# Patient Record
Sex: Male | Born: 2005 | Race: White | Hispanic: No | Marital: Single | Smoking: Never smoker
Health system: Southern US, Community
[De-identification: ages and names within clinical notes are randomized; demographics above are authoritative.]

## PROBLEM LIST (undated history)

## (undated) DIAGNOSIS — G809 Cerebral palsy, unspecified: Secondary | ICD-10-CM

## (undated) DIAGNOSIS — G009 Bacterial meningitis, unspecified: Secondary | ICD-10-CM

## (undated) DIAGNOSIS — I619 Nontraumatic intracerebral hemorrhage, unspecified: Secondary | ICD-10-CM

## (undated) DIAGNOSIS — R6250 Unspecified lack of expected normal physiological development in childhood: Secondary | ICD-10-CM

## (undated) DIAGNOSIS — Z982 Presence of cerebrospinal fluid drainage device: Secondary | ICD-10-CM

## (undated) DIAGNOSIS — R569 Unspecified convulsions: Secondary | ICD-10-CM

## (undated) HISTORY — PX: CIRCUMCISION: SUR203

## (undated) HISTORY — DX: Unspecified convulsions: R56.9

## (undated) HISTORY — PX: VENTRICULOPERITONEAL SHUNT: SHX204

---
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
## 2023-01-03 ENCOUNTER — Encounter (INDEPENDENT_AMBULATORY_CARE_PROVIDER_SITE_OTHER): Payer: Self-pay | Admitting: Pediatrics

## 2023-01-03 ENCOUNTER — Telehealth (INDEPENDENT_AMBULATORY_CARE_PROVIDER_SITE_OTHER): Payer: Self-pay | Admitting: Pediatrics

## 2023-01-03 ENCOUNTER — Other Ambulatory Visit (INDEPENDENT_AMBULATORY_CARE_PROVIDER_SITE_OTHER): Payer: Self-pay | Admitting: Pediatrics

## 2023-01-03 ENCOUNTER — Ambulatory Visit (INDEPENDENT_AMBULATORY_CARE_PROVIDER_SITE_OTHER): Payer: Medicaid Other | Admitting: Dietician

## 2023-01-03 ENCOUNTER — Ambulatory Visit (INDEPENDENT_AMBULATORY_CARE_PROVIDER_SITE_OTHER): Payer: MEDICAID | Admitting: Pediatrics

## 2023-01-03 VITALS — BP 108/74 | HR 76 | Ht 61.0 in | Wt 98.6 lb

## 2023-01-03 DIAGNOSIS — G40209 Localization-related (focal) (partial) symptomatic epilepsy and epileptic syndromes with complex partial seizures, not intractable, without status epilepticus: Secondary | ICD-10-CM

## 2023-01-03 DIAGNOSIS — G8250 Quadriplegia, unspecified: Secondary | ICD-10-CM | POA: Diagnosis not present

## 2023-01-03 DIAGNOSIS — G40309 Generalized idiopathic epilepsy and epileptic syndromes, not intractable, without status epilepticus: Secondary | ICD-10-CM | POA: Diagnosis not present

## 2023-01-03 DIAGNOSIS — G919 Hydrocephalus, unspecified: Secondary | ICD-10-CM | POA: Diagnosis not present

## 2023-01-03 DIAGNOSIS — Z79899 Other long term (current) drug therapy: Secondary | ICD-10-CM

## 2023-01-03 DIAGNOSIS — H722X1 Other marginal perforations of tympanic membrane, right ear: Secondary | ICD-10-CM

## 2023-01-03 DIAGNOSIS — G911 Obstructive hydrocephalus: Secondary | ICD-10-CM

## 2023-01-03 DIAGNOSIS — R451 Restlessness and agitation: Secondary | ICD-10-CM

## 2023-01-03 DIAGNOSIS — G4720 Circadian rhythm sleep disorder, unspecified type: Secondary | ICD-10-CM

## 2023-01-03 MED ORDER — CLOBAZAM 2.5 MG/ML PO SUSP
ORAL | 5 refills | Status: DC
Start: 2023-01-03 — End: 2023-01-03

## 2023-01-03 MED ORDER — RISPERIDONE 1 MG/ML PO SOLN: ORAL | 5 refills | Status: DC

## 2023-01-03 MED ORDER — EPIDIOLEX 100 MG/ML PO SOLN: 0.5000 mL | Freq: Two times a day (BID) | ORAL | 5 refills | Status: DC

## 2023-01-03 MED ORDER — CLOBAZAM 2.5 MG/ML PO SUSP: ORAL | 5 refills | Status: DC

## 2023-01-03 MED ORDER — VALTOCO 10 MG DOSE 10 MG/0.1ML NA LIQD: NASAL | 5 refills | Status: DC

## 2023-01-03 MED ORDER — BACLOFEN 10 MG PO TABS: ORAL_TABLET | ORAL | 5 refills | Status: DC

## 2023-01-03 NOTE — Telephone Encounter (Signed)
Attemptd to contact patiens mother. Mother unable to be reached. Left HIPAA compliant VM informing her that [facility] to [facility] the [facility]. Preferably before the patients next appointment.  SS, CCA

## 2023-01-03 NOTE — Patient Instructions (Addendum)
Recommend 1 container of Prunes every day, 1 cap Miralax every day for now.  If this doesn't work, decrease miralax or prunes until he's having a soft stool every day.    Go to [facility] for lab work: [address]  Recommend increasing Baclofen to 1.5 tablets nightly and decreasing Hydroxyzine to 1 tablet nightly for his sleep and anxiety. In 2 weeks, increase further to Baclofen 2 tablets at night and Hydroxyzine 1.5 tablet nightly. We may consider adding an anti-anxiety medication (known as SSRI) in the future.   We will reach out to the healthcare equipment company about the new bath chair.   Referral sent to [facility] [address] [place] [address]

## 2023-01-03 NOTE — Telephone Encounter (Signed)
  Name of who is calling: Angela  Caller's Relationship to Patient: mom  Best contact [phone] or [phone]  Provider they see: Artis Flock  Reason for call: They went to [facility] for blood work, they closed for lunch when they got there and the next appt isn't until 1;40 so they are not able to wait another 2 hours. Mom wondering if the blood work can be done when he comes to his appt in November. Pls follow up with mom     PRESCRIPTION REFILL ONLY  Name of prescription:  Pharmacy:

## 2023-01-09 ENCOUNTER — Encounter (INDEPENDENT_AMBULATORY_CARE_PROVIDER_SITE_OTHER): Payer: Self-pay

## 2023-02-04 ENCOUNTER — Encounter (INDEPENDENT_AMBULATORY_CARE_PROVIDER_SITE_OTHER): Payer: Self-pay | Admitting: Pediatrics

## 2023-02-22 ENCOUNTER — Telehealth (INDEPENDENT_AMBULATORY_CARE_PROVIDER_SITE_OTHER): Payer: Self-pay | Admitting: Pediatrics

## 2023-02-22 NOTE — Telephone Encounter (Signed)
  Name of who is calling: Nicholas [name]   Caller's Relationship to Patient: dad   Best contact number: [phone]  Provider they see: Artis Flock   Reason for call: Dad called stating that every since they have been trying to get Nicholas [name] off hydroxyzine medication, he has not been sleeping through out the night. He says its getting to the point where he is sleeping at school. He states that they did up baclofen medication up to 2 pills at night and he is not sure if that is helping. He would like a call back regarding this because patient is very restless.      PRESCRIPTION REFILL ONLY  Name of prescription:  Pharmacy:

## 2023-02-22 NOTE — Telephone Encounter (Signed)
Contacted patients father.  Verified patients name and DOB as well as fathers name.   Informed father of the providers being out of the office. Father stated that the patient is very restless and would like to know what he can do. Father threw some suggestions out as far as medication administration. Informed dad that I am unable to advise on that and encouraged that he stick with the regimen Dr. Artis Flock gave.  Dad verbalized understanding of this.  SS, CCMA

## 2023-02-25 NOTE — Telephone Encounter (Signed)
Dad called back regarding this.  Informed dad that I would have respond today.   SS, CCMA

## 2023-02-25 NOTE — Telephone Encounter (Signed)
I returned call for dad.  He reports he sleeps for a little while but gets back up around 3am. Then sleeps during the day at school and is very agitated.  This only occurred when he came off medication, he was ok on 1 tablet of hydroxyzine.   I advised to go back to previous dose of hydroxyzine 1 tablet at night.  Continue baclofen 2 tablets at night.   If this isn't enough, go back to 1.5 tablets at night.    Next plan will be trial of SSRI.  However get him back to sleeping first.  Confirmed he has an appointment with ENT on 10/17, recommend he go to that as well because as we discussed, agitation could be from pain as well.    Dad confirmed plan for medication and appointments.   Lorenz Coaster MD MPH

## 2023-03-27 NOTE — Progress Notes (Signed)
Patient: Nicholas [name] MRN: [id number] Sex: male DOB: [birth year]  Provider: Lorenz Coaster, MD Location of Care: Pediatric Specialist- Pediatric Complex Care Note type: Routine return visit  History of Present Illness: Referral Source: [name], MD History from: patient and prior records Chief Complaint: Complex Care  Nicholas [name] is a 17 y.o. male with history of prematurity, E.Coli meningitis s/p shunt placement and Lennox Gastaut syndrome who I am seeing in follow-up for complex care management. Patient was last seen on 01/03/2023 where I increased baclofen and decreased hydroxyzine, advised prunes and miralax for constipation, continued onfi, epidiolex, and risperdal,resent referral for ENT, and ordered labwork.  Since that appointment, patient's father reached out to report trouble sleeping. He restarted hydroxyzine and we planned on trialing an SSRI at this appointment.   Patient presents today with {CHL AMB PARENT/GUARDIAN:[id number]} who reports the following:   Symptom management:  He weaned off hydroxyzine, which caused him to be more aggressive.  Falls asleep ok, but not staying asleep as well.  Wakes up in the middle of the night 3-4 nighs per week, stays up for an hour and then falls back asleep.  THe next day, he is tired at school and is more aggressive, won't want to eat or drink.    Sometimes aggressive going upstairs, has stair lift grasps at dad which is unsafe.    Unable to get labs.    Went to ENT, aggitation got better.    Care coordination (other providers): Patient was seen by Atilano Ina with [facility] audiology on 02/28/2023 where she recommended a sedated ABR.   Patient was seen by Aquilla Hacker, PA with [facility] on 02/28/2023 and 03/12/2023 where she treated with topical Cipro and dexamethasone drops and rechecked the ear. She continued to recommend strict water precautions and follow up with ENT as needed.   Care management needs:   Equipment  needs:  At the last visit, we were going to follow up on [employer]'s bath chair.   Decision making/Advanced care planning:  Diagnostics/Patient history:  Seizure history:  Seizure semiology: Tremors lasting 30-45 seconds. Can progress to GTC seizures.    Current antiepileptic Drugs:Onfi and Epidiolex   Previous Antiepileptic Drugs (AED): as an infant was on phenobarbital and dilantin. When taken off these medications, he had seizures, when restarted on meds, tried new medications.     Risk Factors: hx of hydrocephalus s/p shunt placement.    Last seizure: 06/06/22   Relevent imaging/EEGS:  EEG January 11, 2012, showed continuous high-voltage spike and slow wave activity consistent with Lennox-Gastaut encephalopathy.  Review of Systems: {cn system review:[id number]}  Past Medical History Past Medical History:  Diagnosis Date   Cerebral palsy (HCC)    Developmental delay    Hemorrhage in the brain [facility])    Meningitis due to bacteria    Seizures (HCC)    VP (ventriculoperitoneal) shunt status     Surgical History Past Surgical History:  Procedure Laterality Date   CIRCUMCISION  2005   HIP SURGERY  2011   2 Hip surgeries    TYMPANOSTOMY TUBE PLACEMENT  2010   VENTRICULOPERITONEAL SHUNT  2005   VENTRICULOPERITONEAL SHUNT REVISION  2011    Family History family history includes Heart Problems in his paternal grandmother.   Social History Social History   Social History Narrative   Nicholas [name] attends [name] and does well in school.    He recieves ST, OT, & PT in school.    He does not receive any  outpatient therapies.    He lives with his mom and dad.   He enjoys toys, TV ([name] Plan, [employer], [employer]), and music.     Allergies Allergies  Allergen Reactions   Valium [Diazepam]     Gets very angry.   Clonidine Other (See Comments)    Other   Hydrocodone-Acetaminophen Other (See Comments)    Makes his mental status much worse      Medications Current Outpatient Medications on File Prior to Visit  Medication Sig Dispense Refill   baclofen (LIORESAL) 10 MG tablet Take 1.5 tablets (15 mg total) by mouth at bedtime for 14 days, THEN 2 tablets (20 mg total) at bedtime for 14 days. MAY CRUSH AND PLACE IN FOOD.. 30 tablet 5   cloBAZam (ONFI) 2.5 MG/ML solution GIVE 4 ML IN AM AND 4 ML IN PM 240 mL 5   diazePAM (VALTOCO 10 MG DOSE) 10 MG/0.1ML LIQD Give 10 mg via nasal spray dispenser in nostril after 2 minutes of seizures 2 each 5   EPIDIOLEX 100 MG/ML solution Take 0.5 mLs (50 mg total) by mouth 2 (two) times daily. 30 mL 5   hydrOXYzine (ATARAX) 10 MG tablet Take 15 mg by mouth 2 (two) times daily. Take 1.5 tablets BID     mineral oil liquid Take 15 mLs by mouth daily as needed.     Nutritional Supplements (NUTRITIONAL SUPPLEMENT PLUS) LIQD 12 oz pediasure grow and gain given PO daily. 11160 mL 12   Nutritional Supplements (NUTRITIONAL SUPPLEMENT PLUS) LIQD 15 mL MCT oil given PO daily. 465 mL 12   polyethylene glycol powder (GLYCOLAX/MIRALAX) 17 GM/SCOOP powder Take 17 g by mouth daily.     risperiDONE (RISPERDAL) 1 MG/ML oral solution TAKE 1.0 ML BY MOUTH IN THE MORNING AND TAKE 1.0 ML AT NIGHT. 62 mL 5   No current facility-administered medications on file prior to visit.   The medication list was reviewed and reconciled. All changes or newly prescribed medications were explained.  A complete medication list was provided to the patient/caregiver.  Physical Exam There were no vitals taken for this visit. Weight for age: No weight on file for this encounter.  Length for age: No height on file for this encounter. BMI: There is no height or weight on file to calculate BMI. No results found.   Diagnosis: No diagnosis found.   Assessment and Plan Nicholas [name] is a 17 y.o. male with history of prematurity, E.Coli meningitis s/p shunt placement and Lennox Gastaut seizures starting in 2013 which were well controlled  on medication until 06/2020 who presents for follow-up in the pediatric complex care clinic. Symptom management:     Care coordination:  Care management needs:   Equipment needs:  Due to patient's medical condition, patient is indefinitely incontinent of stool and urine.  It is medically necessary for them to use diapers, underpads, and gloves to assist with hygiene and skin integrity.  They require a frequency of up to 200 a month.   Decision making/Advanced care planning:  The CARE PLAN for reviewed and revised to represent the changes above.  This is available in Epic under snapshot, and a physical binder provided to the patient, that can be used for anyone providing care for the patient.    I spend 40 minutes on day of service on this patient including review of chart, discussion with patient and family, coordination with other providers and management of orders and paperwork.   Strart trazodone Wean  hydrozyine.  THis may help constipation.  Plan to decrease baclofen at next appointment   No follow-ups on file.  Lorenz Coaster MD MPH Neurology,  Neurodevelopment and Neuropalliative care [facility] Child Neurology  [address] [place], [place], [place] [address] Phone: [phone]

## 2023-04-04 ENCOUNTER — Encounter (INDEPENDENT_AMBULATORY_CARE_PROVIDER_SITE_OTHER): Payer: Self-pay | Admitting: Pediatrics

## 2023-04-04 ENCOUNTER — Ambulatory Visit (INDEPENDENT_AMBULATORY_CARE_PROVIDER_SITE_OTHER): Payer: MEDICAID | Admitting: Pediatrics

## 2023-04-04 VITALS — BP 90/60 | HR 80 | Wt 105.4 lb

## 2023-04-04 DIAGNOSIS — R451 Restlessness and agitation: Secondary | ICD-10-CM

## 2023-04-04 DIAGNOSIS — G40309 Generalized idiopathic epilepsy and epileptic syndromes, not intractable, without status epilepticus: Secondary | ICD-10-CM

## 2023-04-04 DIAGNOSIS — G919 Hydrocephalus, unspecified: Secondary | ICD-10-CM | POA: Diagnosis not present

## 2023-04-04 DIAGNOSIS — G4700 Insomnia, unspecified: Secondary | ICD-10-CM

## 2023-04-04 DIAGNOSIS — G8250 Quadriplegia, unspecified: Secondary | ICD-10-CM

## 2023-04-04 DIAGNOSIS — F79 Unspecified intellectual disabilities: Secondary | ICD-10-CM

## 2023-04-04 DIAGNOSIS — F840 Autistic disorder: Secondary | ICD-10-CM

## 2023-04-04 DIAGNOSIS — Z79899 Other long term (current) drug therapy: Secondary | ICD-10-CM

## 2023-04-04 MED ORDER — BACLOFEN 10 MG PO TABS
20.0000 mg | ORAL_TABLET | Freq: Every day | ORAL | 5 refills | Status: DC
Start: 2023-04-04 — End: 2023-07-15

## 2023-04-04 MED ORDER — TRAZODONE HCL 50 MG PO TABS: 50.0000 mg | ORAL_TABLET | Freq: Every day | ORAL | 3 refills | Status: DC

## 2023-04-04 NOTE — Patient Instructions (Addendum)
Symptom management: Ordered labs to be completed today Give Nicholas [name] his nighttime medications before he goes upstairs and let them kick in before he goes up for bed. Start trazodone Continue Risperdal, clobazam, and Epidiolex  Continue baclofen and hydroxyzine In two weeks, start to decrease hydroxyzine according to the following schedule   Medication     Hydroxyzine Dose 10 mg tablet    PM   Week 1 1 tab (10 mg total)   Week 2 1 tab (10 mg total)   Week 3 0.5 tab (5 mg total)   Week 4 0.5 tab (5 mg total)   Week 5 off     Stop the medication and call me if Nicholas [name] develops symptoms. Please call if you have questions.  Care Coordination: Referred to dietician Care management: Talk to your [employer] case manager about a wheelchair Nicholas [name] can use Medicaid transportation to get Nicholas [name] to his medical appointments. You can use the app or call the number [phone] to set that up.  Equipment needs: Ordered AFOs

## 2023-04-05 LAB — CBC WITH DIFFERENTIAL/PLATELET
Absolute Lymphocytes: 1524 {cells}/uL (ref 850–3900)
Absolute Monocytes: 456 {cells}/uL (ref 200–950)
Basophils Absolute: 39 {cells}/uL (ref 0–200)
Basophils Relative: 0.8 %
Eosinophils Absolute: 279 {cells}/uL (ref 15–500)
Eosinophils Relative: 5.7 %
HCT: 45.4 % (ref 38.5–50.0)
Hemoglobin: 15 g/dL (ref 13.2–17.1)
MCH: 28.8 pg (ref 27.0–33.0)
MCHC: 33 g/dL (ref 32.0–36.0)
MCV: 87.3 fL (ref 80.0–100.0)
MPV: 9.7 fL (ref 7.5–12.5)
Monocytes Relative: 9.3 %
Neutro Abs: 2602 {cells}/uL (ref 1500–7800)
Neutrophils Relative %: 53.1 %
Platelets: 307 10*3/uL (ref 140–400)
RBC: 5.2 10*6/uL (ref 4.20–5.80)
RDW: 12.8 % (ref 11.0–15.0)
Total Lymphocyte: 31.1 %
WBC: 4.9 10*3/uL (ref 3.8–10.8)

## 2023-04-05 LAB — COMPREHENSIVE METABOLIC PANEL
AG Ratio: 1.6 (calc) (ref 1.0–2.5)
ALT: 9 U/L (ref 8–46)
AST: 9 U/L — ABNORMAL LOW (ref 12–32)
Albumin: 4.2 g/dL (ref 3.6–5.1)
Alkaline phosphatase (APISO): 91 U/L (ref 46–169)
BUN/Creatinine Ratio: 22 (calc) (ref 6–22)
BUN: 12 mg/dL (ref 7–20)
CO2: 29 mmol/L (ref 20–32)
Calcium: 9.5 mg/dL (ref 8.9–10.4)
Chloride: 104 mmol/L (ref 98–110)
Creat: 0.54 mg/dL — ABNORMAL LOW (ref 0.60–1.24)
Globulin: 2.6 g/dL (ref 2.1–3.5)
Glucose, Bld: 103 mg/dL (ref 65–139)
Potassium: 4.2 mmol/L (ref 3.8–5.1)
Sodium: 140 mmol/L (ref 135–146)
Total Bilirubin: 0.3 mg/dL (ref 0.2–1.1)
Total Protein: 6.8 g/dL (ref 6.3–8.2)

## 2023-04-05 LAB — LIPID PANEL
Cholesterol: 139 mg/dL (ref <170)
HDL: 32 mg/dL — ABNORMAL LOW (ref >45)
LDL Cholesterol (Calc): 83 mg/dL (ref <110)
Non-HDL Cholesterol (Calc): 107 mg/dL (ref <120)
Total CHOL/HDL Ratio: 4.3 (calc) (ref <5.0)
Triglycerides: 138 mg/dL — ABNORMAL HIGH (ref <90)

## 2023-04-05 LAB — HEMOGLOBIN A1C
Hgb A1c MFr Bld: 5.5 %{Hb} (ref <5.7)
Mean Plasma Glucose: 111 mg/dL
eAG (mmol/L): 6.2 mmol/L

## 2023-04-05 LAB — PROLACTIN: Prolactin: 29.9 ng/mL — ABNORMAL HIGH (ref 2.0–18.0)

## 2023-04-09 ENCOUNTER — Encounter (INDEPENDENT_AMBULATORY_CARE_PROVIDER_SITE_OTHER): Payer: Self-pay

## 2023-04-12 ENCOUNTER — Encounter (INDEPENDENT_AMBULATORY_CARE_PROVIDER_SITE_OTHER): Payer: Self-pay | Admitting: Pediatrics

## 2023-04-22 NOTE — Progress Notes (Incomplete)
   Medical Nutrition Therapy - Progress Note Appt start time: *** Appt end time: *** Reason for referral: oropharyngeal dysphagia Referring provider: Dr. Artis Flock [facility] Pertinent medical hx: self-induced vomiting, hydrocephalus with operating shunt, epilepsy, quadriplegia and quadriparesis, intractable lennox-gastaut syndrome, autism spectrum disorder, moderate intellectual disability  Assessment: Food allergies: none noted in Epic Pertinent Medications: see medication list Vitamins/Supplements: did not ask Pertinent labs:  (11/21) Hgb A1c - 5.5 (WNL) (11/21) Prolactin - 29.9 (high) (11/21) Lipid Panel: HDL - 32 (low), TG - 138 (high) (11/21) CMP: Creatinine - 0.54 (low) (11/21) CBC: WNL  (***) Anthropometrics: The child was weighed, measured, and plotted on the CDC growth chart. Ht: *** cm (*** %)  Z-score: *** Wt: *** kg (*** %)  Z-score: *** BMI: *** (*** %)  Z-score: ***    IBW based on BMI @ 25th%: *** kg  04/04/23 Wt: 47.8 kg 01/03/23 Wt: 44.7 kg 08/09/22 Wt: 45.8 kg 10/12/21 Wt: 43.5 kg 04/20/21 Wt: 42.6 kg 03/09/21 Wt: 45.4 kg  Estimated minimum caloric needs: *** kcal/kg/day (EER) Estimated minimum protein needs: 0.85 g/kg/day (DRI) Estimated minimum fluid needs: *** mL/kg/day (Holliday Segar)  Primary concerns today: Follow-up given pt with dysphagia. *** accompanied pt to virtual appt today.   Dietary Intake Hx: DME: [employer]    Usual eating pattern includes: 3 meals and 1 snacks per day.  Texture modifications: pureed or mashed Chewing or swallowing difficulties with foods and/or liquids: none ***  24-hr recall: *** Breakfast: pureed omelets + french toast + pancakes + 2 yogurts  Snack: pureed applesauce/yogurt Lunch:  pureed lunch @ school OR mom's meals + 1 pudding + 1 pediasure  Snack: pudding  Dinner: puree of whatever family is having protein/starch/vegetable + 2 yogurts + 8 oz prune juice/prune purees Snack: 1 pediasure   Typical Snacks: applesauce,  yogurt, pudding *** Typical Beverages: water w/ flavored packs (48 oz), 1-2 pediasure, prune juice, chocolate milk (occasionally) *** Nutrition Supplement: 1-2 pediasure daily (variety of flavors)   Notes: Per parents, Carlee is a great eater with a good appetite. ***  GI: 1x/day or every other day (soft) - Miralax, 8 oz prune/prune juice, 15 mL mineral oil given daily *** GU: no concern (pale)   Physical Activity: delayed, wheelchair bound  Estimated intake likely meeting needs given reported weight maintenance. *** Pt consuming various food groups.  Pt consuming adequate amounts of each food group. ***  Nutrition Diagnosis: (6/1) Inadequate oral intake related to dysphagia and decreased ability to consume sufficient energy as evidenced by pt dependent on nutritional supplementation to meet nutritional needs.   Intervention: Discussed pt's growth and current regimen. Discussed recommendations below. All questions answered, family in agreement with plan.   Nutrition Recommendations: - ***  Teach back method used.  Monitoring/Evaluation: Continue to Monitor: - Growth trends - PO intake   Follow-up in ***  Total time spent in counseling: *** minutes.

## 2023-04-24 ENCOUNTER — Encounter (INDEPENDENT_AMBULATORY_CARE_PROVIDER_SITE_OTHER): Payer: Self-pay

## 2023-04-25 ENCOUNTER — Ambulatory Visit (INDEPENDENT_AMBULATORY_CARE_PROVIDER_SITE_OTHER): Payer: MEDICAID | Admitting: Dietician

## 2023-06-05 NOTE — Progress Notes (Incomplete)
Patient: Nicholas [name] MRN: [id number] Sex: male DOB: August 21, 2005  Provider: Lorenz Coaster, MD Location of Care: Pediatric Specialist- Pediatric Complex Care Note type: Routine return visit  History of Present Illness: Referral Source: [name], MD History from: patient and prior records Chief Complaint: Complex Care  Nicholas [name] is a 18 y.o. male with history of prematurity, E.Coli meningitis s/p shunt placement and Lennox Gastaut syndrome who I am seeing in follow-up for complex care management. Patient was last seen on 04/04/2023 where I advised when to give sleep medications, started trazodone, continued baclofen and hydroxyzine with a plan to wean hydroxyzine two weeks after the appointment, planned to wean baclofen at this appointment, and referred to the dietician.  Since that appointment, patient has not been in the ED or been hospitalized.     Patient presents today with {CHL AMB PARENT/GUARDIAN:[id number]} who reports the following:   Symptom management:     Care coordination (other providers): Patient was scheduled with [name], RD on 04/25/2023 but that appointment was cancelled.   Case management needs:  At the last visit, advised talking to [employer] waiver case manager about wheelchair [name] and provided information about Medicaid transportation.   Equipment needs:  At the last visit, ordered AFOs.   Decision making/Advanced care planning:  Diagnostics/Patient history:  Seizure history:  Seizure semiology: Tremors lasting 30-45 seconds. Can progress to GTC seizures.    Current antiepileptic Drugs:Onfi and Epidiolex   Previous Antiepileptic Drugs (AED): as an infant was on phenobarbital and dilantin. When taken off these medications, he had seizures, when restarted on meds, tried new medications.     Risk Factors: hx of hydrocephalus s/p shunt placement.    Last seizure: 06/06/22   Relevent imaging/EEGS:  EEG January 11, 2012, showed  continuous high-voltage spike and slow wave activity consistent with Lennox-Gastaut encephalopathy.   2005 VP shunt   01/11/2012 EEG: Profoundly abnormal EEG generalized slow       spike & wave discharge consistent with Lennox-Gastaut        encephalopathy. 04/10/2018 Head CT without Contrast : Moderate hydrocephalus. This appears increased since 2010 exam  Past Medical History Past Medical History:  Diagnosis Date   Cerebral palsy (HCC)    Developmental delay    Hemorrhage in the brain [facility])    Meningitis due to bacteria    Seizures (HCC)    VP (ventriculoperitoneal) shunt status     Surgical History Past Surgical History:  Procedure Laterality Date   CIRCUMCISION  2005   HIP SURGERY  2011   2 Hip surgeries    TYMPANOSTOMY TUBE PLACEMENT  2010   VENTRICULOPERITONEAL SHUNT  2005   VENTRICULOPERITONEAL SHUNT REVISION  2011    Family History family history includes Heart Problems in his paternal grandmother.   Social History Social History   Social History Narrative   [name] attends [name] and does well in school.    He recieves ST, OT, & PT in school.    He does not receive any outpatient therapies.    He lives with his mom and dad.   He enjoys toys, TV ([name] Plan, [place], Blues Clues), and music.     Allergies Allergies  Allergen Reactions   Valium [Diazepam]     Gets very angry.   Clonidine Other (See Comments)    Other   Hydrocodone-Acetaminophen Other (See Comments)    Makes his mental status much worse     Medications Current Outpatient Medications on File  Prior to Visit  Medication Sig Dispense Refill   baclofen (LIORESAL) 10 MG tablet Take 2 tablets (20 mg total) by mouth at bedtime. MAY CRUSH AND PLACE IN FOOD. 60 tablet 5   cloBAZam (ONFI) 2.5 MG/ML solution GIVE 4 ML IN AM AND 4 ML IN PM 240 mL 5   diazePAM (VALTOCO 10 MG DOSE) 10 MG/0.1ML LIQD Give 10 mg via nasal spray dispenser in nostril after 2 minutes of seizures 2 each  5   EPIDIOLEX 100 MG/ML solution Take 0.5 mLs (50 mg total) by mouth 2 (two) times daily. 30 mL 5   hydrOXYzine (ATARAX) 10 MG tablet Take 10 mg by mouth at bedtime.     mineral oil liquid Take 15 mLs by mouth daily as needed.     Nutritional Supplements (NUTRITIONAL SUPPLEMENT PLUS) LIQD 12 oz pediasure grow and gain given PO daily. 11160 mL 12   polyethylene glycol powder (GLYCOLAX/MIRALAX) 17 GM/SCOOP powder Take 17 g by mouth daily.     risperiDONE (RISPERDAL) 1 MG/ML oral solution TAKE 1.0 ML BY MOUTH IN THE MORNING AND TAKE 1.0 ML AT NIGHT. 62 mL 5   traZODone (DESYREL) 50 MG tablet Take 1 tablet (50 mg total) by mouth at bedtime. 30 tablet 3   No current facility-administered medications on file prior to visit.   The medication list was reviewed and reconciled. All changes or newly prescribed medications were explained.  A complete medication list was provided to the patient/caregiver.  Physical Exam There were no vitals taken for this visit. Weight for age: No weight on file for this encounter.  Length for age: No height on file for this encounter. BMI: There is no height or weight on file to calculate BMI. No results found.   Diagnosis: No diagnosis found.   Assessment and Plan Terran L Mulford is a 18 y.o. male with history of prematurity, E.Coli meningitis s/p shunt placement and Lennox Gastaut syndrome who presents for follow-up in the pediatric complex care clinic.  Symptom management:     Care coordination:  Case management needs:   Equipment needs:  Due to patient's medical condition, patient is indefinitely incontinent of stool and urine.  It is medically necessary for them to use diapers, underpads, and gloves to assist with hygiene and skin integrity.  They require a frequency of up to 200 a month.   Decision making/Advanced care planning:  The CARE PLAN for reviewed and revised to represent the changes above.  This is available in Epic under snapshot, and a  physical binder provided to the patient, that can be used for anyone providing care for the patient.    I spend ** minutes on day of service on this patient including review of chart, discussion with patient and family, coordination with other providers and management of orders and paperwork.      No follow-ups on file.  Lorenz Coaster MD MPH Neurology,  Neurodevelopment and Neuropalliative care [facility] Child Neurology  [address] [place], [place], [place] [address] Phone: [phone]

## 2023-06-10 ENCOUNTER — Ambulatory Visit (INDEPENDENT_AMBULATORY_CARE_PROVIDER_SITE_OTHER): Payer: MEDICAID | Admitting: Pediatrics

## 2023-07-10 NOTE — Progress Notes (Signed)
 Patient: Nicholas [name] MRN: [id number] Sex: male DOB: 2005-12-02  Provider: Lorenz Coaster, MD Location of Care: Pediatric Specialist- Pediatric Complex Care Note type: Routine return visit  History of Present Illness: Referral Source: [name], MD History from: patient and prior records Chief Complaint: Complex Care   [name] is a 18 y.o. male with history of prematurity, E.Coli meningitis s/p shunt placement and Lennox Gastaut syndrome who I am seeing in follow-up for complex care management. Patient was last seen on 04/04/2023 where I advised when to give sleep medications, started trazodone, continued baclofen and hydroxyzine with a plan to wean hydroxyzine two weeks after the appointment, planned to wean baclofen at this appointment, and referred to the dietician. Since that appointment, patient has not been in the ED or been hospitalized.   Patient presents today with parents who reports the following:   Symptom management:  Seizures have been well controlled, no seizures since last appointment.    Still having outbursts for no reason, but seems better than it used to be.  He got upset this morning when mom wiped his hands, clamped down into mom's hand.  The last bad event was about a month ago when dad put him in the stair lift.   Trazodone working well as far as him staying asleep.  Still wakes up occasionally but doesn't stay up very long.  Fatigue and related aggression has improved at school because of this.  They are now getting good reports from school, it has been months.  They give trazodone about 30-45 minutes before getting in the lift.    Dad does not want to give hydroxyzine again as this previously caused constipation.  Only giving miralax about 3 times weekly.    Care coordination (other providers): Patient was scheduled with [name], RD on 04/25/2023 but that appointment was cancelled. He is eating well, full PO diet.    Dr [name] is still  the PCP.  He is willing to see him until 19yo.   Planning to stay with [name], continuing OT, PT, SLP, VISION.    Case management needs:  At the last visit, advised talking to [employer] waiver case manager about wheelchair [name] and provided information about Medicaid transportation.  They bought a [name] out of pocket, used from a [employer].    Equipment needs:  At the last visit, ordered AFOs. They still haven't gotten them due to insurance.  Per dad, they have the wrong PCP on the card ([facility] who they never saw). Case manager says she can't help.  Diagnostics/Patient history:  Seizure history:  Seizure semiology: Tremors lasting 30-45 seconds. Can progress to GTC seizures.    Current antiepileptic Drugs:Onfi and Epidiolex   Previous Antiepileptic Drugs (AED): as an infant was on phenobarbital and dilantin. When taken off these medications, he had seizures, when restarted on meds, tried new medications.     Risk Factors: hx of hydrocephalus s/p shunt placement.    Last seizure: 06/06/22   Relevent imaging/EEGS:  EEG January 11, 2012, showed continuous high-voltage spike and slow wave activity consistent with Lennox-Gastaut encephalopathy.   08/16/2003 VP shunt    01/11/2012 EEG: Profoundly abnormal EEG generalized slow       spike & wave discharge consistent with Lennox-Gastaut        encephalopathy. 04/10/2018 Head CT without Contrast : Moderate hydrocephalus. This appears increased since 2010 exam  Past Medical History Past Medical History:  Diagnosis Date   Cerebral palsy (  HCC)    Developmental delay    Hemorrhage in the brain [facility])    Meningitis due to bacteria    Seizures (HCC)    VP (ventriculoperitoneal) shunt status     Surgical History Past Surgical History:  Procedure Laterality Date   CIRCUMCISION  12-17-2003   HIP SURGERY  2011   2 Hip surgeries    TYMPANOSTOMY TUBE PLACEMENT  2010   VENTRICULOPERITONEAL SHUNT  01/10/2004   VENTRICULOPERITONEAL  SHUNT REVISION  2011    Family History family history includes Heart Problems in his paternal grandmother.   Social History Social History   Social History Narrative   Geoffrey attends [name] and does well in school.    He recieves ST, OT, & PT in school.    He does not receive any outpatient therapies.    He lives with his mom and dad.   He enjoys toys, TV ([name] Plan, [name], Blues Clues), and music.     Allergies Allergies  Allergen Reactions   Valium [Diazepam]     Gets very angry.   Clonidine Other (See Comments)    Other   Hydrocodone-Acetaminophen Other (See Comments)    Makes his mental status much worse     Medications Current Outpatient Medications on File Prior to Visit  Medication Sig Dispense Refill   mineral oil liquid Take 15 mLs by mouth daily as needed.     polyethylene glycol powder (GLYCOLAX/MIRALAX) 17 GM/SCOOP powder Take 17 g by mouth daily.     diazePAM (VALTOCO 10 MG DOSE) 10 MG/0.1ML LIQD Give 10 mg via nasal spray dispenser in nostril after 2 minutes of seizures 2 each 5   No current facility-administered medications on file prior to visit.   The medication list was reviewed and reconciled. All changes or newly prescribed medications were explained.  A complete medication list was provided to the patient/caregiver.  Physical Exam BP 110/70 (BP Location: Right Arm, Patient Position: Sitting, Cuff Size: Normal)   Pulse 100   Ht 5' 1.22" (1.555 m)   Wt 102 lb (46.3 kg)   BMI 19.13 kg/m  Weight for age: <1 %ile (Z= -3.20) based on CDC (Boys, 2-20 Years) weight-for-age data using data from 07/15/2023.  Length for age: <1 %ile (Z= -2.96) based on CDC (Boys, 2-20 Years) Stature-for-age data based on Stature recorded on 07/15/2023. BMI: Body mass index is 19.13 kg/m. No results found. Gen: well appearing neuroaffected teen Skin: No rash, No neurocutaneous stigmata. HEENT: Microcephalic, no dysmorphic features, no conjunctival  injection, nares patent, mucous membranes moist, oropharynx clear.  Neck: Supple, no meningismus. No focal tenderness. Resp: Clear to auscultation bilaterally CV: Regular rate, normal S1/S2, no murmurs, no rubs Abd: BS present, abdomen soft, non-tender, non-distended. No hepatosplenomegaly or mass Ext: Warm and well-perfused. No deformities, no muscle wasting, ROM full.   Neurological Examination: MS: Awake, alert.  Nonverbal, but interactive, reacts appropriately to conversation.   Cranial Nerves: Pupils were equal and reactive to light;  No clear visual field defect, no nystagmus; no ptsosis, face symmetric with full strength of facial muscles, hearing grossly intact, palate elevation is symmetric. Motor-Fairly normal tone throughout, moves extremities at least antigravity. No abnormal movements Reflexes- Reflexes 2+ and symmetric in the biceps, triceps, patellar and achilles tendon. Plantar responses flexor bilaterally, no clonus noted Sensation: Responds to touch in all extremities.  Coordination: Does not reach for objects.  Gait: wheelchair dependent  Diagnosis:  1. Quadriplegia and quadriparesis (HCC)   2. Generalized  convulsive epilepsy (HCC)   3. Partial epilepsy with impairment of consciousness (HCC)   4. Dysfunctions associated with sleep stages or arousal from sleep   5. Agitation      Assessment and Plan Loris L Rushlow is a 18 y.o. male with history of prematurity, E.Coli meningitis s/p shunt placement and Lennox Gastaut syndrome who presents for follow-up in the pediatric complex care clinic.  Symptom management: Increase Trazodone to 1.5 tablets Continue all other medications as prescribed. Changed all medications to a three month supply Ordered Boost Breeze  Care Coordination: Let me know when you are ready to transition [place] to an adult PCP and I can refer to an in-home PCP Care management: We will try to get [facility] to send an updated card with the correct  PCP You can also try to contact your [employer] Waiver case manager about updating the PCP on your [place] card Equipment needs: Ordered AFOs. We will reach out to [employer] for them to send [place] the paperwork Due to patient's medical condition, patient is indefinitely incontinent of stool and urine.  It is medically necessary for them to use diapers, underpads, and gloves to assist with hygiene and skin integrity.  They require a frequency of up to 200 a month.  The CARE PLAN for reviewed and revised to represent the changes above.  This is available in Epic under snapshot, and a physical binder provided to the patient, that can be used for anyone providing care for the patient.    I spend 70 minutes on day of service on this patient including review of chart, discussion with patient and family, coordination with other providers and management of orders and paperwork.      Return in about 3 months (around 10/15/2023).  Lorenz Coaster MD MPH Neurology,  Neurodevelopment and Neuropalliative care [facility] Pediatric Specialists Child Neurology  [address] [place], [place], [place] [address] Phone: [phone]

## 2023-07-12 ENCOUNTER — Other Ambulatory Visit (HOSPITAL_COMMUNITY): Payer: Self-pay

## 2023-07-12 ENCOUNTER — Telehealth (INDEPENDENT_AMBULATORY_CARE_PROVIDER_SITE_OTHER): Payer: Self-pay | Admitting: Pharmacy Technician

## 2023-07-12 NOTE — Telephone Encounter (Signed)
 Pharmacy Patient Advocate Encounter  Received notification from [facility] that Prior Authorization for risperiDONE 1MG /ML solution has been APPROVED from 07/12/2023 to 07/11/2024. Ran test claim, Copay is $0.00. This test claim was processed through [facility]- copay amounts may vary at other pharmacies due to pharmacy/plan contracts, or as the patient moves through the different stages of their insurance plan.   PA #/Case ID/Reference #: [id number]

## 2023-07-12 NOTE — Telephone Encounter (Signed)
 Pharmacy Patient Advocate Encounter   Received notification from CoverMyMeds that prior authorization for risperiDONE 1MG /ML solution is required/requested.   Insurance verification completed.   The patient is insured through [facility] .   Per test claim: PA required; PA submitted to above mentioned insurance via CoverMyMeds Key/confirmation #/EOC [facility] Status is pending

## 2023-07-15 ENCOUNTER — Other Ambulatory Visit (INDEPENDENT_AMBULATORY_CARE_PROVIDER_SITE_OTHER): Payer: Self-pay | Admitting: Pediatrics

## 2023-07-15 ENCOUNTER — Encounter (INDEPENDENT_AMBULATORY_CARE_PROVIDER_SITE_OTHER): Payer: Self-pay | Admitting: Pediatrics

## 2023-07-15 ENCOUNTER — Telehealth (INDEPENDENT_AMBULATORY_CARE_PROVIDER_SITE_OTHER): Payer: Self-pay | Admitting: Pediatrics

## 2023-07-15 ENCOUNTER — Ambulatory Visit (INDEPENDENT_AMBULATORY_CARE_PROVIDER_SITE_OTHER): Payer: MEDICAID | Admitting: Pediatrics

## 2023-07-15 DIAGNOSIS — G40209 Localization-related (focal) (partial) symptomatic epilepsy and epileptic syndromes with complex partial seizures, not intractable, without status epilepticus: Secondary | ICD-10-CM

## 2023-07-15 DIAGNOSIS — R451 Restlessness and agitation: Secondary | ICD-10-CM

## 2023-07-15 DIAGNOSIS — G4720 Circadian rhythm sleep disorder, unspecified type: Secondary | ICD-10-CM | POA: Diagnosis not present

## 2023-07-15 DIAGNOSIS — G8250 Quadriplegia, unspecified: Secondary | ICD-10-CM

## 2023-07-15 DIAGNOSIS — G40309 Generalized idiopathic epilepsy and epileptic syndromes, not intractable, without status epilepticus: Secondary | ICD-10-CM

## 2023-07-15 MED ORDER — NUTRITIONAL SUPPLEMENT PLUS PO LIQD: ORAL | 12 refills | Status: DC

## 2023-07-15 MED ORDER — CLOBAZAM 2.5 MG/ML PO SUSP: ORAL | 3 refills | Status: DC

## 2023-07-15 MED ORDER — BACLOFEN 10 MG PO TABS: 20.0000 mg | ORAL_TABLET | Freq: Every day | ORAL | 2 refills | Status: DC

## 2023-07-15 MED ORDER — CLOBAZAM 2.5 MG/ML PO SUSP: ORAL | 5 refills | Status: DC

## 2023-07-15 MED ORDER — TRAZODONE HCL 50 MG PO TABS: 75.0000 mg | ORAL_TABLET | Freq: Every day | ORAL | 3 refills | Status: DC

## 2023-07-15 MED ORDER — RISPERIDONE 1 MG/ML PO SOLN: ORAL | 3 refills | Status: DC

## 2023-07-15 MED ORDER — EPIDIOLEX 100 MG/ML PO SOLN: 0.5000 mL | Freq: Two times a day (BID) | ORAL | 3 refills | Status: DC

## 2023-07-15 NOTE — Telephone Encounter (Signed)
  Name of who is calling: roger   Caller's Relationship to Patient: father  Best contact number: [phone]  Provider they see: Artis Flock   Reason for call: [facility] generic he can't take that has to be the name brand. Can't do 3 month supply. Generic causes seizures. Also Boost rx needs to [facility] ? not [facility], dad would like a call on update.     PRESCRIPTION REFILL ONLY  Name of prescription:  Pharmacy:

## 2023-07-15 NOTE — Patient Instructions (Addendum)
 Symptom management: Increase Trazodone to 1.5 tablets Changed all medications to a three month supply Ordered Boost Breeze  Care Coordination: Let me know when you are ready to transition Noemi to an adult PCP and I can refer to an in-home PCP Care management: We will try to get [facility] to send an updated card with the correct PCP You can also try to contact your [employer] Waiver case manager about updating the PCP on your [place] card Equipment needs: Ordered AFOs. We will reach out to [employer] for them to send [place] the paperwork

## 2023-07-15 NOTE — Telephone Encounter (Signed)
  Name of who is calling: [facility]  Caller's Relationship to Patient: pharmacy  Best contact number: [phone]    Provider they see: Artis Flock  Reason for call: patient's pharmacy called to inform the practice that the patient's nutritional supplement was sent to the wrong pharmacy. [facility] would be unable to fill it.      PRESCRIPTION REFILL ONLY  Name of prescription: nutritional supplement   Pharmacy: [facility]

## 2023-07-15 NOTE — Telephone Encounter (Signed)
 Please see separate encounter.   SS, CCMA

## 2023-07-15 NOTE — Telephone Encounter (Signed)
 Mr. Fredrik Cove stated that patients Onfi must be name brand and can be sent to [facility].   Patients Nutritional supplements need to be send to [facility].   SS, CCMA

## 2023-07-16 ENCOUNTER — Telehealth (INDEPENDENT_AMBULATORY_CARE_PROVIDER_SITE_OTHER): Payer: Self-pay | Admitting: Pediatrics

## 2023-07-16 NOTE — Telephone Encounter (Signed)
 Called [facility] in [place] to request that they send the AFO paperwork to our clinic instead of the PCP. They reported that they had all of the paperwork they need and that the AFOs will be delivered tomorrow, 3/5, to the patient at school.

## 2023-07-19 MED ORDER — NUTRITIONAL SUPPLEMENT PLUS PO LIQD: ORAL | 12 refills | Status: DC

## 2023-07-19 NOTE — Addendum Note (Signed)
 Addended by: Margurite Auerbach on: 07/19/2023 12:14 AM   Modules accepted: Orders

## 2023-07-19 NOTE — Telephone Encounter (Signed)
 3/3 Onfi brand name prescription printed and provided to [place] to fax.    Boost order sent to [place] on accident. Patient will need prescription printed and faxed to [facility].    Lorenz Coaster MD MPH

## 2023-07-29 ENCOUNTER — Telehealth (INDEPENDENT_AMBULATORY_CARE_PROVIDER_SITE_OTHER): Payer: Self-pay | Admitting: Family

## 2023-07-29 ENCOUNTER — Other Ambulatory Visit (INDEPENDENT_AMBULATORY_CARE_PROVIDER_SITE_OTHER): Payer: Self-pay | Admitting: Pediatrics

## 2023-07-29 ENCOUNTER — Other Ambulatory Visit (INDEPENDENT_AMBULATORY_CARE_PROVIDER_SITE_OTHER): Payer: Self-pay

## 2023-07-29 ENCOUNTER — Encounter (INDEPENDENT_AMBULATORY_CARE_PROVIDER_SITE_OTHER): Payer: Self-pay | Admitting: Pediatrics

## 2023-07-29 DIAGNOSIS — G40309 Generalized idiopathic epilepsy and epileptic syndromes, not intractable, without status epilepticus: Secondary | ICD-10-CM

## 2023-07-29 DIAGNOSIS — G40209 Localization-related (focal) (partial) symptomatic epilepsy and epileptic syndromes with complex partial seizures, not intractable, without status epilepticus: Secondary | ICD-10-CM

## 2023-07-29 MED ORDER — NUTRITIONAL SUPPLEMENT PLUS PO LIQD: ORAL | 12 refills | Status: DC

## 2023-07-29 MED ORDER — ONFI 2.5 MG/ML PO SUSP: ORAL | 3 refills | Status: DC

## 2023-07-29 NOTE — Telephone Encounter (Signed)
 Dad called back in stating that he is in need of the Brand name of Onfi and not the generic.   SS, CCMA

## 2023-07-29 NOTE — Telephone Encounter (Signed)
 Rx sent for Brand Name. TG

## 2023-07-29 NOTE — Telephone Encounter (Signed)
 Received transferred call from dad.   Informed dad that this medication was denied due to duplication. Two prescriptions were sent for the same medication. Informed dad that [facility]   Dad verbalized understanding of this.   SS, CCMA

## 2023-07-29 NOTE — Telephone Encounter (Addendum)
 Sending to Pharmacy Tech.   SS, CCMA

## 2023-07-29 NOTE — Telephone Encounter (Signed)
  Name of who is calling:  roger   Caller's Relationship to Patient: father   Best contact number: [phone]   Provider they see: Blane Ohara   Reason for call: Father called in regarding a text from pharmacy about medication being denied, sent teams message to see if CMA or [name] was available to talk to him due to this. Transferred him to [facility] sierra     PRESCRIPTION REFILL ONLY  Name of prescription:  Pharmacy:

## 2023-07-29 NOTE — Telephone Encounter (Signed)
  Name of who is calling: ROGER  Caller's Relationship to Patient: FATHER   Best contact number: [phone]   Provider they see: [facility]   Reason for call: PHARMACY IS NEEDING A PA FOR EPIDOLEX DAD STATED COULD YOU DO A 90DAY SUPPLY, DONT HAVE ENOUGH TO LAST A WEEK? DAD WOULD LIKE A CALL BACK.      PRESCRIPTION REFILL ONLY  Name of prescription:   Pharmacy:

## 2023-07-29 NOTE — Telephone Encounter (Signed)
 Please see previous encounters.  SS, CCMA

## 2023-07-29 NOTE — Telephone Encounter (Addendum)
  Name of who is calling: Molenda,Angela J   Caller's Relationship to Patient: Mother   Best contact number: [phone]   Provider they see: Artis Flock  Reason for call: Patient's mother called reporting that the patient was near running out of ONFI. She reports having contacted the pharmacy at [facility] and being told the patient's ONFI had been rejected, then a generic ONFI had been prescribed, which the patient was unable to take due to the generic causing seizures. The patient's mother requested a prescription for  non-generic (ONFI) [facility] [place] [facility] [address]. Pharmacist will take verbal at. [phone], opt 2, ask for [facility].      PRESCRIPTION REFILL ONLY  [facility]   Pharmacy: ONFI

## 2023-07-30 ENCOUNTER — Other Ambulatory Visit (HOSPITAL_COMMUNITY): Payer: Self-pay

## 2023-07-30 ENCOUNTER — Telehealth (INDEPENDENT_AMBULATORY_CARE_PROVIDER_SITE_OTHER): Payer: Self-pay | Admitting: Pharmacy Technician

## 2023-07-30 ENCOUNTER — Telehealth (INDEPENDENT_AMBULATORY_CARE_PROVIDER_SITE_OTHER): Payer: Self-pay | Admitting: Pediatrics

## 2023-07-30 NOTE — Telephone Encounter (Signed)
 Pharmacy Patient Advocate Encounter  Received notification from [facility] that Prior Authorization for Epidiolex 100MG /ML solution has been APPROVED from 07/30/2023 to 07/29/2024. Ran test claim, Copay is $0.00. This test claim was processed through [facility]- copay amounts may vary at other pharmacies due to pharmacy/plan contracts, or as the patient moves through the different stages of their insurance plan.   PA #/Case ID/Reference #: [id number]

## 2023-07-30 NOTE — Telephone Encounter (Signed)
 Refaxed RX to a different fax number.   SS, CCMA

## 2023-07-30 NOTE — Telephone Encounter (Signed)
 PA request has been Submitted. New Encounter has been or will be created for follow up. For additional info see Pharmacy Prior Auth telephone encounter from 07/30/2023.

## 2023-07-30 NOTE — Telephone Encounter (Signed)
 Who's calling (name and relationship to patient) : Macey: [facility] [place] contact number: [phone]  Provider they see: [facility]  Reason for call: Macey called in stating that she spoke with a nurse yesterday regarding Rx, she she stated that a new Rx was supposed to be sent over for brand name Onsi. She is requesting a call back.    Call ID:      PRESCRIPTION REFILL ONLY  Name of prescription:  Pharmacy:

## 2023-07-30 NOTE — Telephone Encounter (Signed)
 Pharmacy Patient Advocate Encounter   Received notification from CoverMyMeds that prior authorization for Epidiolex 100MG /ML solution is required/requested.   Insurance verification completed.   The patient is insured through [facility] .   Per test claim: PA required; PA submitted to above mentioned insurance via CoverMyMeds Key/confirmation #/EOC [id number] Status is pending

## 2023-08-01 NOTE — Telephone Encounter (Signed)
 Previous prescription for Epidiolex was for 90 day supply.    Lorenz Coaster MD MPH

## 2023-09-02 ENCOUNTER — Encounter (INDEPENDENT_AMBULATORY_CARE_PROVIDER_SITE_OTHER): Payer: Self-pay

## 2023-10-02 ENCOUNTER — Telehealth (INDEPENDENT_AMBULATORY_CARE_PROVIDER_SITE_OTHER): Payer: Self-pay | Admitting: Pediatrics

## 2023-10-02 NOTE — Telephone Encounter (Signed)
 [facility] [address]  I spoke a representative by the name of Tyra Galley.  Tyra Galley stated that Nicholas [name] had two profiles, she was able to locate the Brand Name prescription.   Contacted patients dad to make him aware of this. Dad verbalized understanding of this.   SS, CCMA

## 2023-10-02 NOTE — Telephone Encounter (Signed)
  Name of who is calling: Roger   Caller's Relationship to Patient: dad  Best contact number: [phone]  Provider they see: Francesco Inks   Reason for call: called because pts refill got rejected. He states it was sent in as the generic name and not the brand name. He would like this re sent as the brand name as soon as possible pt will be out by tonight. He would like a call back as soon as its sent.      PRESCRIPTION REFILL ONLY  Name of prescription: Onfi    Pharmacy: [facility] [address]

## 2023-10-23 NOTE — Progress Notes (Signed)
 Patient: Nicholas [name] MRN: [id number] Sex: male DOB: 2006-04-01  Provider: Corean Geralds, MD Location of Care: Pediatric Specialist- Pediatric Complex Care Note type: Routine return visit  History of Present Illness: Referral Source: Elsie Conner, MD History from: patient and prior records Chief Complaint: Complex Care  Nicholas [name] is a 18 y.o. male with history of prematurity, E.Coli meningitis s/p shunt placement and Lennox Gastaut syndrome who I am seeing in follow-up for complex care management. Patient was last seen on 07/15/2023 where I increased Trazodone , refilled other medications for a three month supply, ordered Boost Breeze, and discussed in-home PCP.  Since that appointment, patient has not been to the ED or hospital.    Patient presents today with parents who reports the following:   Symptom management:  No seizures since last appointment.   Parents feel his clothes fit the same and he feels the same when they lift him. He is getting 12 oz Pediasure grow and gain, they never got Nicholas [name]. He likes yogurt drinks. He has been eating well, no obvious changes.   He is sleeping well since increasing Trazodone . He falls asleep and stays asleep well.   They were only able to get a two month supply of Epidiolex .   Still having some aggression. It is only happening at certain times but it seems to come out of nowhere. It is also usually quick.   Case management needs:  At the last appointment, discussed patient's [facility] card and that it did not have the correct PCP. After calling [employer], they resent the card in late February but it could take some time to be delivered to the family. When the card did arrive, it was still the incorrect PCP listed. They still have not received a card with the right PCP listed.   No specific plan for the summer but he will visit a day program occasionally.   Has a PT that is coming to the house for an equipment  evaluation.   Equipment needs:  At the last visit, ordered AFOs that were delivered on 07/17/2023.   Diagnostics/Patient history:  Seizure history:  Seizure semiology: Tremors lasting 30-45 seconds. Can progress to GTC seizures.    Current antiepileptic Drugs:Onfi  and Epidiolex    Previous Antiepileptic Drugs (AED): as an infant was on phenobarbital and dilantin. When taken off these medications, he had seizures, when restarted on meds, tried new medications.     Risk Factors: hx of hydrocephalus s/p shunt placement.    Last seizure: 06/06/22   Relevent imaging/EEGS:  EEG January 11, 2012, showed continuous high-voltage spike and slow wave activity consistent with Lennox-Gastaut encephalopathy.   2005 VP shunt    01/11/2012 EEG: Profoundly abnormal EEG generalized slow       spike & wave discharge consistent with Lennox-Gastaut        encephalopathy. 04/10/2018 Head CT without Contrast : Moderate hydrocephalus. This appears increased since 2010 exam  Past Medical History Past Medical History:  Diagnosis Date   Cerebral palsy (HCC)    Developmental delay    Hemorrhage in the brain [facility])    Meningitis due to bacteria    Seizures (HCC)    VP (ventriculoperitoneal) shunt status     Surgical History Past Surgical History:  Procedure Laterality Date   CIRCUMCISION  2005   HIP SURGERY  2011   2 Hip surgeries    TYMPANOSTOMY TUBE PLACEMENT  2010   VENTRICULOPERITONEAL SHUNT  2005   VENTRICULOPERITONEAL SHUNT REVISION  2011    Family History family history includes Heart Problems in his paternal grandmother.   Social History Social History   Social History Narrative   Larrie attends [employer] and does well in school.    He recieves ST, OT, & PT in school.    He does not receive any outpatient therapies.    He lives with his mom and dad.   He enjoys toys, TV (Yo Nicholas [name], [employer], [employer]), and music.     Allergies Allergies  Allergen Reactions    Valium [Diazepam ]     Gets very angry.   Clonidine  Other (See Comments)    Other   Hydrocodone-Acetaminophen  Other (See Comments)    Makes his mental status much worse     Medications Current Outpatient Medications on File Prior to Visit  Medication Sig Dispense Refill   cloBAZam  (ONFI ) 2.5 MG/ML solution GIVE 4 ML IN AM AND 4 ML IN PM 720 mL 3   EPIDIOLEX  100 MG/ML solution Take 0.5 mLs (50 mg total) by mouth 2 (two) times daily. 100 mL 3   mineral oil liquid Take 15 mLs by mouth daily as needed.     ONFI  2.5 MG/ML solution Give 4ml in the morning and give 4ml at night 720 mL 3   polyethylene glycol powder (GLYCOLAX/MIRALAX) 17 GM/SCOOP powder Take 17 g by mouth daily.     risperiDONE  (RISPERDAL ) 1 MG/ML oral solution TAKE 1.0 ML BY MOUTH IN THE MORNING AND TAKE 1.0 ML AT NIGHT. 185 mL 3   traZODone  (DESYREL ) 50 MG tablet Take 1.5 tablets (75 mg total) by mouth at bedtime. 140 tablet 3   diazePAM  (VALTOCO  10 MG DOSE) 10 MG/0.1ML LIQD Give 10 mg via nasal spray dispenser in nostril after 2 minutes of seizures 2 each 5   No current facility-administered medications on file prior to visit.   The medication list was reviewed and reconciled. All changes or newly prescribed medications were explained.  A complete medication list was provided to the patient/caregiver.  Physical Exam BP 102/70 (BP Location: Right Arm, Patient Position: Sitting, Cuff Size: Normal)   Pulse 84   Wt 93 lb 9.6 oz (42.5 kg)   BMI 17.56 kg/m  Weight for age: Facility age limit for growth %iles is 20 years.  Length for age: Facility age limit for growth %iles is 20 years. BMI: Body mass index is 17.56 kg/m. No results found. Gen: well appearing neuroaffected young man Skin: No rash, No neurocutaneous stigmata. HEENT: Microcephalic, no dysmorphic features, no conjunctival injection, nares patent, mucous membranes moist, oropharynx clear.  Neck: Supple, no meningismus. No focal tenderness. Resp: Clear to  auscultation bilaterally CV: Regular rate, normal S1/S2, no murmurs, no rubs Abd: BS present, abdomen soft, non-tender, non-distended. No hepatosplenomegaly or mass Ext: Warm and well-perfused. No deformities, no muscle wasting, ROM full.   Neurological Examination: MS: Awake, alert.  Nonverbal, but interactive, reacts appropriately to conversation.   Cranial Nerves: Pupils were equal and reactive to light;  No clear visual field defect, no nystagmus; no ptsosis, face symmetric with full strength of facial muscles, hearing grossly intact, palate elevation is symmetric. Motor-Fairly normal tone throughout, moves extremities at least antigravity. No abnormal movements Reflexes- Reflexes 2+ and symmetric in the biceps, triceps, patellar and achilles tendon. Plantar responses flexor bilaterally, no clonus noted Sensation: Responds to touch in all extremities.  Coordination: Does not reach for objects.  Gait: wheelchair dependent  Diagnosis:  1. Autism spectrum disorder with accompanying  language impairment and intellectual disability, requiring very substantial support   2. Quadriplegia and quadriparesis (HCC)   3. Generalized convulsive epilepsy (HCC)   4. Partial epilepsy with impairment of consciousness (HCC)      Assessment and Plan Nicholas [name] is a 18 y.o. male with history of prematurity, E.Coli meningitis s/p shunt placement and Lennox Gastaut syndrome who presents for follow-up in the pediatric complex care clinic. Patient sleeping well so will decrease Baclofen  and continue Trazodone  if he starts to have trouble sleeping. Patient has lost weight so recommend increasing formula to 2 cartons per day as tolerated and recommend follow up with the dietician. Patient having aggressive behaviors so referred to ABA to determine triggers and recommend discussing transfers with PT to determine if aggression related to pain.   Symptom management:  Decrease Baclofen  to 1.5 tablets at  night Continue Trazodone  at current dose. If he has trouble sleeping, let me know and we can increase Continue all other medications Plan to call the pharmacy about a three month's supply of Epidiolex  Increase formula to 1-2 cartons per day Plan to follow up on Boost Breeze order with [facility]  Care coordination: Recommend calling [facility] if interested in home-based primary care Recommend follow up with the dietician  Case management needs:  Referred to ABA therapy at [facility] for aggressive behaviors I recommend discussing transfers with the PT coming to your house about his recommendations We will send in school forms at the end of the summer straight to [employer]  Equipment needs:  Due to patient's medical condition, patient is indefinitely incontinent of stool and urine.  It is medically necessary for them to use diapers, underpads, and gloves to assist with hygiene and skin integrity.  They require a frequency of up to 200 a month.  Decision making/Advanced care planning: Not addressed at this visit, patient remains at full code.   The CARE PLAN for reviewed and revised to represent the changes above.  This is available in Epic under snapshot, and a physical binder provided to the patient, that can be used for anyone providing care for the patient.    I spend 55 minutes on day of service on this patient including review of chart, discussion with patient and family, coordination with other providers and management of orders and paperwork. This time does not include does include any behavioral screenings, baclofen  pump refills, or VNS interrogations.  Return in about 3 months (around 01/28/2024).  I, Earnie Brandy, scribed for and in the presence of Corean Geralds, MD at today's visit on 10/28/2023.  I, Corean Geralds MD MPH, personally performed the services described in this documentation, as scribed by Earnie Brandy in my presence on 10/28/2023 and it is accurate,  complete, and reviewed by me.     Corean Geralds MD MPH Neurology,  Neurodevelopment and Neuropalliative care [facility] Child Neurology  [address] [place], [place], [place] [address] Phone: [phone]

## 2023-10-28 ENCOUNTER — Ambulatory Visit (INDEPENDENT_AMBULATORY_CARE_PROVIDER_SITE_OTHER): Payer: MEDICAID | Admitting: Pediatrics

## 2023-10-28 ENCOUNTER — Encounter (INDEPENDENT_AMBULATORY_CARE_PROVIDER_SITE_OTHER): Payer: Self-pay | Admitting: Pediatrics

## 2023-10-28 VITALS — BP 102/70 | HR 84 | Wt 93.6 lb

## 2023-10-28 DIAGNOSIS — G8250 Quadriplegia, unspecified: Secondary | ICD-10-CM

## 2023-10-28 DIAGNOSIS — F840 Autistic disorder: Secondary | ICD-10-CM

## 2023-10-28 DIAGNOSIS — G40209 Localization-related (focal) (partial) symptomatic epilepsy and epileptic syndromes with complex partial seizures, not intractable, without status epilepticus: Secondary | ICD-10-CM

## 2023-10-28 DIAGNOSIS — G40309 Generalized idiopathic epilepsy and epileptic syndromes, not intractable, without status epilepticus: Secondary | ICD-10-CM | POA: Diagnosis not present

## 2023-10-28 MED ORDER — BACLOFEN 10 MG PO TABS: 15.0000 mg | ORAL_TABLET | Freq: Every day | ORAL | 2 refills | Status: DC

## 2023-10-28 MED ORDER — NUTRITIONAL SUPPLEMENT PLUS PO LIQD: ORAL | 12 refills | Status: DC

## 2023-10-28 NOTE — Patient Instructions (Addendum)
 Symptom management: Decrease Baclofen  to 1.5 tablets at night Continue Trazodone  at current dose. If he has trouble sleeping, let me know and we can increase Continue all other medications We will call the pharmacy about a three month's supply of Epidiolex  Increase formula to 1-2 cartons per day We will follow up on Boost Breeze order with [facility] Care Coordination: Call [facility] at ph to let them know you are interested in home-based primary care Care management: Referred to ABA therapy at [facility] for aggressive behaviors I recommend discussing transfers with the PT coming to your house about his recommendations We will send in school forms at the end of the summer straight to [employer]

## 2023-11-18 ENCOUNTER — Telehealth (INDEPENDENT_AMBULATORY_CARE_PROVIDER_SITE_OTHER): Payer: Self-pay | Admitting: Pediatrics

## 2023-11-18 NOTE — Telephone Encounter (Signed)
 Trazodone  can cause constipation, but there are many components to constipation, and we have not made changes to trazodone  lately.  We have also tried alternative medications for sleep, including clonidine .    For now, I recommend decreasing trazodone  to 1 tablet nightly and seeing if that helps, rather than stopping.    I also recommend scheduling an appointment with the PCP to discuss constipation.  If they can't get in with that provider, ok to schedule with me or Ellouise to address constipation management. This would be a focused visit, ok to put in a 30 minute slot.   Corean Geralds MD MPH

## 2023-11-18 NOTE — Telephone Encounter (Signed)
 Contacted patients father.  Verified patients name and DOB as well as fathers name.  Father stated that the last time Mayo had a movement was this morning and it wasn't anything to right home about. Mom has been rubbing caster oil on his stomach.   Patinets stomach is not distended but dad stated that he can feel the lumps in his stomach.   Dad also stated that he was able to pass a substantial amount Wednesday after an enema. Nicholas [name] also had an enema yesterday and they weren't able to get anything but brown liquid.  Dad has tried this medication before and got the same results. Dad is asking for an alternative.     SS, CCMA

## 2023-11-18 NOTE — Telephone Encounter (Signed)
 Dad called in because he said the patient has been really constipated. Dad believes that it's the Trazodone  because he was put on Trazodone  last Wed and he has been constipated himself. Dad said prunes and Miralax helped in the past, but are no longer doing the trick. He said that an enema is not even working. Mom is currently using castor oil to see if that will work. Please reach out to dad about an alternate medication.

## 2023-11-19 NOTE — Telephone Encounter (Signed)
 Contacted patients father.  Verified patients name and DOB as well as fathers name.   I relayed the previous message from provider to dad. Dad verbalized understanding if this.   SS, CCMA

## 2023-11-26 NOTE — Progress Notes (Incomplete)
 Medical Nutrition Therapy - Initial Assessment Appt start time: 9:30 AM  Appt end time: 10:15 AM Reason for referral: oropharyngeal dysphagia Referring provider: Dr. Waddell [facility] Pertinent medical hx: self-induced vomiting, hydrocephalus with operating shunt, epilepsy, quadriplegia and quadriparesis, wheelchair dependent, Lennox-Gastaut syndrome, autism spectrum disorder, moderate intellectual disability, constipation  Primary concerns today: Appt given pt with dysphagia and constipation. Dad accompanied pt appt today.   Assessment: Food allergies: none known  Pertinent Medications: see medication list Vitamins/Supplements: none Pertinent labs: no recent labs in Epic  (11/28/2023) Anthropometrics:  Wt Readings from Last 3 Encounters:  11/28/23 94 lb (42.6 kg)  10/28/23 93 lb 9.6 oz (42.5 kg)  07/15/23 102 lb (46.3 kg) (<1%, Z= -3.20)*   * Growth percentiles are based on CDC (Boys, 2-20 Years) data.    Ht Readings from Last 3 Encounters:  11/28/23 5' 1.02 (1.55 m) **  07/15/23 5' 1.22 (1.555 m) (<1%, Z= -2.96)*  01/03/23 5' 1 (1.549 m) (<1%, Z= -3.02)*   * Growth percentiles are based on CDC (Boys, 2-20 Years) data.  **Used same height as 07/15/23 per father request. He reported that Santino would get aggressive if trying to get knee height measurement.   BMI Readings from Last 3 Encounters:  11/28/23 17.75 kg/m  10/28/23 17.56 kg/m  07/15/23 19.13 kg/m (5%, Z= -1.64)*   * Growth percentiles are based on CDC (Boys, 2-20 Years) data.   IBW based on BMI @ 18.5 kg/m2: 44.5 kg  Wt trends: Kerron lost 3.7 kg in 4 months.   Estimated Minimum Needs: Calories: 37 kcal/kg/day (DRI x catch-up growth) Protein: 0.85 g/kg/day (DRI x catch-up growth) Fluids: 46 mL/kg/day (Holliday Segar)   Dietary Intake Hx: DME: [facility]    Usual eating pattern includes: 3 meals and 3 snacks per day.  Texture modifications: pureed or mashed Chewing or swallowing difficulties with foods  and/or liquids: none  24-hr recall: Breakfast (8 AM): pancakes, syrup, mal[place] sausage patty pureed meal (~450 kcal), splash water  Snack (10 AM): 2 yogurts + 1 jello pudding Lunch (11 AM): chicken enchiladas + cinnamon apples pureed meal (~450 kcal) Snack (1 PM): 2 yogurts Dinner (3 PM): pork roast + sweet potatoes + green beans pureed meal (~450-500 kcal) + prune juice 8oz Snack (5 PM): 2 yogurts  Total estimated calorie intake: ~2000 kcal (47 kcal/kg/day)  Typical Snacks: chobanni and activia yogurt, pudding Typical Beverages: plain water or water w/ flavored packs (32 oz), body armor (occasionally), prune/apple juice   Nutrition Supplement: Boost Original  Previous Nutrition Supplements Tried: Pediasure G&G (variety of flavors)   Notes: Dad reported that [employer]'s pureed meals are delivered at home. They are balanced meals with a source of carb + protein + fruits/veggies.  Holt seems to have a good appetite for the meals, but drinking habits depends on his mood. He drinks about 32 oz water/day and drinks 1-2 Boost supplements when in a good mood. Dad reported that is worried about Jory's constipation, where he can go several days w/o a BM. He also reported that Miralax will cause diarrhea if given daily, but it doesn't promote BM if given every other day. Still offers prune juice and mineral oil, but reported that it does not help as much. Dad reported that previously used a fiber supplement and that helped Quentavious have frequent BM.  N/V: none GI: gets constipated for several days, hard stools - Miralax, 8 oz prune/prune juice, 15 mL mineral oil given daily  GU: 5+/day  Physical  Activity: delayed, wheelchair bound  Estimated intake likely not meeting needs given weight loss. Pt consuming various food groups.  Pt consuming adequate amounts of each food group.  Nutrition Diagnosis: Inadequate oral intake related to dysphagia and decreased ability to consume sufficient energy as  evidenced by pt dependent on nutritional supplementation to meet nutritional needs. (Ongoing)  Intervention: Discussed pt's growth and current regimen. Discussed recommendations below. All questions answered, family in agreement with plan.   Nutrition Recommendations: - Continue serving Derwin a wide variety of all food groups.  - Offer 1-2 Boost daily - Add 1 tbsp of oil (olive oil, butter, avocado oil, etc) to his prepared meals to increase calories and prevent further wt loss.  - Increase fluid intake to a total of 60-65 oz/day (Try at least 3 water bottles (48 oz) + 1 Boost supplement and 8 oz of prune juice/day) - Benefiber use per the label: For ages 72 and above, stir 2 teaspoons of Benefiber Original into 4-8 oz. of beverage or soft food (hot or cold), three times daily. Stir well until dissolved (up to 60 seconds.) Not recommended for carbonated beverages. - I will send the Benefiber order to [facility] to check if they can provide it.   Monitoring/Evaluation: Continue to Monitor: - Growth trends - PO intake - Fluid intake - Constipation  Follow-up in 6 months, joint with Dr. Waddell.  Total time spent in assessment, counseling and documentation: 90 minutes.

## 2023-11-28 ENCOUNTER — Ambulatory Visit (INDEPENDENT_AMBULATORY_CARE_PROVIDER_SITE_OTHER): Payer: MEDICAID

## 2023-11-28 VITALS — Ht 61.02 in | Wt 94.0 lb

## 2023-11-28 DIAGNOSIS — R1312 Dysphagia, oropharyngeal phase: Secondary | ICD-10-CM | POA: Diagnosis not present

## 2023-11-28 DIAGNOSIS — K5900 Constipation, unspecified: Secondary | ICD-10-CM | POA: Diagnosis not present

## 2023-11-28 DIAGNOSIS — R638 Other symptoms and signs concerning food and fluid intake: Secondary | ICD-10-CM

## 2023-11-28 MED ORDER — BENEFIBER PO POWD: ORAL | 12 refills | Status: AC

## 2023-11-28 MED ORDER — BENEPROTEIN PO POWD: ORAL | 12 refills | Status: DC

## 2023-11-28 NOTE — Patient Instructions (Addendum)
 Increase water intake to 60-65 oz/day (Try at least 3 water bottles + 1 Boost supplement and 8 oz of prune juice/day) Benefiber use per the label: - For ages 33 and above, stir 2 teaspoons of Benefiber Original into 4-8 oz. of beverage or soft food (hot or cold), three times daily. Stir well until dissolved (up to 60 seconds.) Not recommended for carbonated beverages.

## 2023-12-02 ENCOUNTER — Encounter (INDEPENDENT_AMBULATORY_CARE_PROVIDER_SITE_OTHER): Payer: Self-pay

## 2023-12-02 ENCOUNTER — Encounter (INDEPENDENT_AMBULATORY_CARE_PROVIDER_SITE_OTHER): Payer: Self-pay | Admitting: Pediatrics

## 2023-12-03 ENCOUNTER — Encounter (INDEPENDENT_AMBULATORY_CARE_PROVIDER_SITE_OTHER): Payer: Self-pay | Admitting: Pediatrics

## 2023-12-26 ENCOUNTER — Telehealth (INDEPENDENT_AMBULATORY_CARE_PROVIDER_SITE_OTHER): Payer: Self-pay

## 2023-12-26 NOTE — Telephone Encounter (Signed)
 Dad called in to inquire about school forms.    SS, CCMA

## 2023-12-31 NOTE — Telephone Encounter (Signed)
 Forms were faxed to the school and placed in the mail to the parents.

## 2024-01-04 ENCOUNTER — Other Ambulatory Visit (INDEPENDENT_AMBULATORY_CARE_PROVIDER_SITE_OTHER): Payer: Self-pay | Admitting: Pediatrics

## 2024-01-04 DIAGNOSIS — G40309 Generalized idiopathic epilepsy and epileptic syndromes, not intractable, without status epilepticus: Secondary | ICD-10-CM

## 2024-01-07 ENCOUNTER — Other Ambulatory Visit (INDEPENDENT_AMBULATORY_CARE_PROVIDER_SITE_OTHER): Payer: Self-pay | Admitting: Pediatrics

## 2024-01-07 MED ORDER — VALTOCO 10 MG DOSE 10 MG/0.1ML NA LIQD: 1.0000 | NASAL | 5 refills | Status: AC | PRN

## 2024-01-07 NOTE — Telephone Encounter (Signed)
 [facility] called to report that VALTOCO  medication no longer comes in a 2 pack it is now a 5 pack. She would like the future  request sent in as a 5pk.
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
## 2024-01-23 ENCOUNTER — Telehealth (INDEPENDENT_AMBULATORY_CARE_PROVIDER_SITE_OTHER): Payer: Self-pay | Admitting: Pediatrics

## 2024-01-23 NOTE — Telephone Encounter (Signed)
  Name of who is calling: roger  Caller's Relationship to Patient: father   Best contact number: [phone]   Provider they see: waddell   Reason for call: wanting to know is it okay to have virtual visit instead of in person, he would like a call back regarding this      PRESCRIPTION REFILL ONLY  Name of prescription:  Pharmacy:

## 2024-01-23 NOTE — Telephone Encounter (Signed)
 Contacted patients father.  Verified patients name and DOB as well as fathers name.   Informed dad that appointment that can be virtual. Changed appointment to virtual.   SS, CCMA

## 2024-01-28 ENCOUNTER — Telehealth (INDEPENDENT_AMBULATORY_CARE_PROVIDER_SITE_OTHER): Payer: Self-pay | Admitting: Pediatrics

## 2024-01-28 ENCOUNTER — Other Ambulatory Visit (INDEPENDENT_AMBULATORY_CARE_PROVIDER_SITE_OTHER): Payer: Self-pay | Admitting: Family

## 2024-01-28 DIAGNOSIS — G40209 Localization-related (focal) (partial) symptomatic epilepsy and epileptic syndromes with complex partial seizures, not intractable, without status epilepticus: Secondary | ICD-10-CM

## 2024-01-28 DIAGNOSIS — G40309 Generalized idiopathic epilepsy and epileptic syndromes, not intractable, without status epilepticus: Secondary | ICD-10-CM

## 2024-01-28 NOTE — Telephone Encounter (Signed)
  Name of who is calling: Roger  Caller's Relationship to Patient: Dad  Best contact number: [phone]  Provider they see: Waddell  Reason for call: Dad is calling for a refill on prescription.     PRESCRIPTION REFILL ONLY  Name of prescription: ONFI    [facility] [address] [place]

## 2024-01-28 NOTE — Telephone Encounter (Signed)
 Refill sent to the provider for approval in a separate encounter.   SS, CCMA

## 2024-01-28 NOTE — Telephone Encounter (Signed)
  Name of who is calling: roger   Caller's Relationship to Patient: father  Best contact number: [phone]  Provider they see: waddell   Reason for call: Father is calling wondering why the refill was denied, he would like a call back, said pharmacy sent him a message saying it was denied.      PRESCRIPTION REFILL ONLY  Name of prescription:  Pharmacy:

## 2024-01-29 NOTE — Telephone Encounter (Signed)
 Contacted patients father.  Verified patients name and DOB as well as fathers name.   Informed father of the reason the medication was denied. Informed dad that the prescription would be sent today, asked which pharmacy he would like for the medication to be sent to? Dad stated that he would [facility] [address].   Prescription faxed to pharmacy of choice.   SS, CCMA

## 2024-01-30 ENCOUNTER — Telehealth (INDEPENDENT_AMBULATORY_CARE_PROVIDER_SITE_OTHER): Payer: MEDICAID | Admitting: Pediatrics

## 2024-01-30 ENCOUNTER — Encounter (INDEPENDENT_AMBULATORY_CARE_PROVIDER_SITE_OTHER): Payer: Self-pay | Admitting: Pediatrics

## 2024-01-30 DIAGNOSIS — T8509XA Other mechanical complication of ventricular intracranial (communicating) shunt, initial encounter: Secondary | ICD-10-CM | POA: Diagnosis not present

## 2024-01-30 DIAGNOSIS — Z79899 Other long term (current) drug therapy: Secondary | ICD-10-CM | POA: Diagnosis not present

## 2024-01-30 DIAGNOSIS — F71 Moderate intellectual disabilities: Secondary | ICD-10-CM

## 2024-01-30 DIAGNOSIS — G40209 Localization-related (focal) (partial) symptomatic epilepsy and epileptic syndromes with complex partial seizures, not intractable, without status epilepticus: Secondary | ICD-10-CM | POA: Diagnosis not present

## 2024-01-30 DIAGNOSIS — R451 Restlessness and agitation: Secondary | ICD-10-CM

## 2024-01-30 DIAGNOSIS — G4720 Circadian rhythm sleep disorder, unspecified type: Secondary | ICD-10-CM | POA: Diagnosis not present

## 2024-01-30 DIAGNOSIS — F840 Autistic disorder: Secondary | ICD-10-CM

## 2024-01-30 DIAGNOSIS — R634 Abnormal weight loss: Secondary | ICD-10-CM

## 2024-01-30 DIAGNOSIS — G4700 Insomnia, unspecified: Secondary | ICD-10-CM

## 2024-01-30 DIAGNOSIS — G8250 Quadriplegia, unspecified: Secondary | ICD-10-CM

## 2024-01-30 MED ORDER — NUTRITIONAL SUPPLEMENT PLUS PO LIQD: ORAL | 12 refills | Status: DC

## 2024-01-30 MED ORDER — EPIDIOLEX 100 MG/ML PO SOLN: 0.5000 mL | Freq: Two times a day (BID) | ORAL | 3 refills | Status: AC

## 2024-01-30 MED ORDER — BACLOFEN 10 MG PO TABS
10.0000 mg | ORAL_TABLET | Freq: Every day | ORAL | 2 refills | Status: AC
Start: 2024-01-30 — End: ?

## 2024-01-30 MED ORDER — EPIDIOLEX 100 MG/ML PO SOLN: 0.5000 mL | Freq: Two times a day (BID) | ORAL | 3 refills | Status: DC

## 2024-01-30 MED ORDER — TRAZODONE HCL 50 MG PO TABS: 50.0000 mg | ORAL_TABLET | Freq: Every day | ORAL | 3 refills | Status: AC

## 2024-01-30 MED ORDER — RISPERIDONE 1 MG/ML PO SOLN: ORAL | 3 refills | Status: AC

## 2024-01-30 NOTE — Patient Instructions (Addendum)
 Symptom management: Decrease baclofen  to 1/2 tablet at night for one week. If he does well, you can stop the baclofen .  Continue Risperdal  1 mL twice per day and add 1 mL as needed for agitation (such as on Wednesday afternoons) Start Miralax two days in a row and one day off.  Continue Onfi  and Trazodone  Refilled Epidiolex  We will send an order for a new formula for [place] to try. I will discuss with the dietician about which one to try.  Can consider medication for appetite if he continues to struggle with his weight.  Care Coordination: Referred to [facility] in-home PCP Care management: If you are interested, I could refer to [facility] in-home PT to discuss transfers and equipment.  We will reach out to [facility] to determine the status of the referral.

## 2024-02-03 ENCOUNTER — Telehealth (INDEPENDENT_AMBULATORY_CARE_PROVIDER_SITE_OTHER): Payer: Self-pay | Admitting: Pediatrics

## 2024-02-03 NOTE — Telephone Encounter (Signed)
 Contacted patients father.  Verified patients name and DOB as well as fathers name.   Before I could relay the message from the provider dad said that he'd gotten a message form Tanners teacher letting him know that Quantrell didn't eat today. Dad said that he would like to make the provider aware of this.   I relayed the message from the provider and dad questioned if he should wait the week or two if Zakariye still isn't eating?  Mr. Sharolyn didn't mention any decrease in appetite over the weekend when we spoke earlier so, I asked if Milfred ate over the weekend and Mr. Sharolyn stated that he ate, but not like he usually does.  Because of this, I asked Mr. Sharolyn to continue to monitor Tanners appetite and to call after a week if Yamato's appetite continues to decrease.   Mr. Sharolyn also mentioned that everyone in the home have been battling an aliment. They all took COVID tests that turned out to be negative. Dad believes that this may be a cold, I informed dad that this may the the reason for Platon's decrease in appetite.   Dad agreed to call back after a week if Otilio's appetite continues to decrease.   SS, CCMA

## 2024-02-03 NOTE — Telephone Encounter (Signed)
 I recommend increasing trazodone  to 1.5 tablets nightly.  Give this for one week, and if he still isn't sleeping well may increase to 2 tablets nightly.    Please call us  in the next 1-2 weeks to let us  know what worked and we can write a new prescription for the dose they ended up on.   Corean Geralds MD MPH

## 2024-02-03 NOTE — Telephone Encounter (Signed)
 Who's calling (name and relationship to patient) : Mister Krahenbuhl; dad   Best contact number: [phone]  Provider they see: dr. Waddell   Reason for call: Dad called in to let provider know that Px is  not working, he his not sleeping,screaming at the top of his lungs wide open. He stated that meds was decreased. He requested a call back regarding concerns.    Call ID:      PRESCRIPTION REFILL ONLY  Name of prescription:  Pharmacy:

## 2024-02-03 NOTE — Telephone Encounter (Signed)
 Contacted patients father.  Verified patients name and DOB as well as mothers name.   Dad stated that since decreasing his baclofen  tablets Nicholas [name] hasn't been sleeping well and he has been very vocal when awake.   Dad wanted to know if he could increase his trazodone ? He stated that this was mentioned in the visit last week.   SS, CCMA

## 2024-02-06 NOTE — Telephone Encounter (Signed)
 I agree with this plan [place], thanks.   Corean

## 2024-02-10 ENCOUNTER — Encounter (INDEPENDENT_AMBULATORY_CARE_PROVIDER_SITE_OTHER): Payer: Self-pay

## 2024-02-12 ENCOUNTER — Encounter (INDEPENDENT_AMBULATORY_CARE_PROVIDER_SITE_OTHER): Payer: Self-pay

## 2024-03-16 ENCOUNTER — Encounter (INDEPENDENT_AMBULATORY_CARE_PROVIDER_SITE_OTHER): Payer: Self-pay | Admitting: Pediatrics

## 2024-04-06 ENCOUNTER — Other Ambulatory Visit (INDEPENDENT_AMBULATORY_CARE_PROVIDER_SITE_OTHER): Payer: Self-pay

## 2024-04-13 ENCOUNTER — Telehealth (INDEPENDENT_AMBULATORY_CARE_PROVIDER_SITE_OTHER): Payer: Self-pay | Admitting: Pediatrics

## 2024-04-13 DIAGNOSIS — G8250 Quadriplegia, unspecified: Secondary | ICD-10-CM

## 2024-04-13 DIAGNOSIS — G4700 Insomnia, unspecified: Secondary | ICD-10-CM

## 2024-04-13 DIAGNOSIS — F840 Autistic disorder: Secondary | ICD-10-CM

## 2024-04-13 NOTE — Telephone Encounter (Signed)
 The patient's father called to ask whether the doctor could recommend anything to help reduce the patient's aggression. He reports that the patient has been biting, hitting, and scratching. The father also stated that the patient will "sit and just pound his head." He expressed that the behaviors have become significantly worse.  CB # [phone]
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
## 2024-04-15 NOTE — Telephone Encounter (Signed)
 Tereso's behaviors have previously been around specific things like going upstairs,showering, and environmental changes.  I have previously recommended ABA to evaluate what about these activities specifically he doesn't like and to help family with behaviors.  I again recommend this to help manage his behaviors.  We can only get ABA until he is 19yo, so we need to work fast if they are willing.   In the meantime, illness and lack of sleep can definitely increase behavior problems. For short term sleep problems, I recommend Benedryl 25-50mg  before bedtime.  He is ordered for 2 tablets of trazodone , do not change from what is ordered. I do expect that as his illness improves, his behavior will as well.   He is scheduled to see me and the dietician on 04/30/24.  Please have him come in person so we can get a weight and talk about this further.   Corean Geralds MD MPH

## 2024-04-15 NOTE — Addendum Note (Signed)
 Addended by: WADDELL COREAN HERO on: 04/15/2024 09:07 AM   Modules accepted: Orders

## 2024-04-15 NOTE — Telephone Encounter (Signed)
 Contacted patients father.  Verified patients name and DOB as well as fathers name.   I relayed the message from the provider to dad.  Dad stated that Kentravious does not get home until 4:00 PM and he does not think that Arturo will sit and watch them look at him for 4 hours.   I explained what ABA Therapy was and dad was still very apprehensive in accepting ABA Therapy, stating that the therapist aren't going to know what is triggering the episodes.   Dad asked if the therapist would be helping him transport Duell throughout the house as he has trouble lifting him and things like that.   I again explained what ABA services were and dad stated that it would be him against the world.   After a litter more explanation dad accepted ABA Therapy.   SS, CCMA

## 2024-04-15 NOTE — Telephone Encounter (Signed)
 New ABA referral placed, as old one was over 43 months old.  Please send with previous notes.    Did you also give medication recommendations for sleep as well?   Corean Geralds MD MPH

## 2024-04-16 ENCOUNTER — Encounter (INDEPENDENT_AMBULATORY_CARE_PROVIDER_SITE_OTHER): Payer: Self-pay

## 2024-04-16 NOTE — Telephone Encounter (Signed)
 Thank you :)

## 2024-04-16 NOTE — Telephone Encounter (Signed)
Referral sent to [facility].  SS, CCMA

## 2024-04-20 NOTE — Progress Notes (Deleted)
 Medical Nutrition Therapy - Initial Assessment Appt start time: 9:30 AM  Appt end time: 10:15 AM Reason for referral: oropharyngeal dysphagia Referring provider: Dr. Waddell [facility]  Pertinent medical hx: self-induced vomiting, hydrocephalus with operating shunt, epilepsy, quadriplegia and quadriparesis, wheelchair dependent, Lennox-Gastaut syndrome, autism spectrum disorder, moderate intellectual disability, constipation  Primary concerns today: Appt given pt with dysphagia and constipation. Dad accompanied pt appt today.   Assessment: Food allergies: none known  Pertinent Medications: see medication list Vitamins/Supplements: none Pertinent labs: no recent labs in Epic   Anthropometrics:  Wt Readings from Last 5 Encounters:  11/28/23 94 lb (42.6 kg)  10/28/23 93 lb 9.6 oz (42.5 kg)  07/15/23 102 lb (46.3 kg) (<1%, Z= -3.20)*  04/04/23 105 lb 6.4 oz (47.8 kg) (<1%, Z= -2.89)*  01/03/23 98 lb 9.6 oz (44.7 kg) (<1%, Z= -3.49)*   * Growth percentiles are based on CDC (Boys, 2-20 Years) data.    Ht Readings from Last 5 Encounters:  11/28/23 5' 1.02 (1.55 m)  07/15/23 5' 1.22 (1.555 m) (<1%, Z= -2.96)*  01/03/23 5' 1 (1.549 m) (<1%, Z= -3.02)*  08/09/22 5' 0.34 (1.533 m) (<1%, Z= -3.23)*  10/12/21 4' 5.72 (1.364 m) (<1%, Z= -5.34)*   * Growth percentiles are based on CDC (Boys, 2-20 Years) data.     BMI Readings from Last 5 Encounters:  11/28/23 17.75 kg/m  10/28/23 17.56 kg/m  07/15/23 19.13 kg/m (5%, Z= -1.64)*  04/04/23 19.92 kg/m (12%, Z= -1.18)*  01/03/23 18.63 kg/m (4%, Z= -1.81)*   * Growth percentiles are based on CDC (Boys, 2-20 Years) data.      (04/20/2024) Anthropometrics:  Wt Readings from Last 3 Encounters:  11/28/23 94 lb (42.6 kg)  10/28/23 93 lb 9.6 oz (42.5 kg)  07/15/23 102 lb (46.3 kg) (<1%, Z= -3.20)*   * Growth percentiles are based on CDC (Boys, 2-20 Years) data.    Ht Readings from Last 3 Encounters:  11/28/23 5' 1.02 (1.55  m) **  07/15/23 5' 1.22 (1.555 m) (<1%, Z= -2.96)*  01/03/23 5' 1 (1.549 m) (<1%, Z= -3.02)*   * Growth percentiles are based on CDC (Boys, 2-20 Years) data.  **Used same height as 07/15/23 per father request. He reported that Maruice would get aggressive if trying to get knee height measurement.   BMI Readings from Last 3 Encounters:  11/28/23 17.75 kg/m  10/28/23 17.56 kg/m  07/15/23 19.13 kg/m (5%, Z= -1.64)*   * Growth percentiles are based on CDC (Boys, 2-20 Years) data.   IBW based on BMI @ 18.5 kg/m2: 44.5 kg  Wt trends: Kaz lost 3.7 kg in 4 months.   Estimated Minimum Needs: Calories: 37 kcal/kg/day (DRI x catch-up growth) Protein: 0.85 g/kg/day (DRI x catch-up growth) Fluids: 46 mL/kg/day (Holliday Segar)   Dietary Intake Hx: DME: [facility]    Usual eating pattern includes: 3 meals and 3 snacks per day.  Texture modifications: pureed or mashed Chewing or swallowing difficulties with foods and/or liquids: none  24-hr recall: Breakfast (8 AM): pancakes, syrup, turkey sausage patty pureed meal (~450 kcal), splash water  Snack (10 AM): 2 yogurts + 1 jello pudding Lunch (11 AM): chicken enchiladas + cinnamon apples pureed meal (~450 kcal) Snack (1 PM): 2 yogurts Dinner (3 PM): pork roast + sweet potatoes + green beans pureed meal (~450-500 kcal) + prune juice 8oz Snack (5 PM): 2 yogurts  Total estimated calorie intake: ~2000 kcal (47 kcal/kg/day)  Typical Snacks: chobanni and activia yogurt, pudding  Typical Beverages: plain water or water w/ flavored packs (32 oz), body armor (occasionally), prune/apple juice   Nutrition Supplement: Boost Original  Previous Nutrition Supplements Tried: Pediasure G&G (variety of flavors)   Notes: Dad reported that Xaiver's pureed meals are delivered at home. They are balanced meals with a source of carb + protein + fruits/veggies.  Deontae seems to have a good appetite for the meals, but drinking habits depends on his mood. He  drinks about 32 oz water/day and drinks 1-2 Boost supplements when in a good mood. Dad reported that is worried about Jaycion's constipation, where he can go several days w/o a BM. He also reported that Miralax will cause diarrhea if given daily, but it doesn't promote BM if given every other day. Still offers prune juice and mineral oil, but reported that it does not help as much. Dad reported that previously used a fiber supplement and that helped Avik have frequent BM.  N/V: none GI: gets constipated for several days, hard stools - Miralax, 8 oz prune/prune juice, 15 mL mineral oil given daily  GU: 5+/day  Physical Activity: delayed, wheelchair bound  Estimated intake likely not meeting needs given weight loss. Pt consuming various food groups.  Pt consuming adequate amounts of each food group.  Nutrition Diagnosis: Inadequate oral intake related to dysphagia and decreased ability to consume sufficient energy as evidenced by pt dependent on nutritional supplementation to meet nutritional needs. (Ongoing)  Intervention: Discussed pt's growth and current regimen. Discussed recommendations below. All questions answered, family in agreement with plan.   Nutrition Recommendations: - Continue serving Finn a wide variety of all food groups.  - Offer 1-2 Boost daily - Add 1 tbsp of oil (olive oil, butter, avocado oil, etc) to his prepared meals to increase calories and prevent further wt loss.  - Increase fluid intake to a total of 60-65 oz/day (Try at least 3 water bottles (48 oz) + 1 Boost supplement and 8 oz of prune juice/day) - Benefiber use per the label: For ages 76 and above, stir 2 teaspoons of Benefiber Original into 4-8 oz. of beverage or soft food (hot or cold), three times daily. Stir well until dissolved (up to 60 seconds.) Not recommended for carbonated beverages. - I will send the Benefiber order to [facility] to check if they can provide it.   Monitoring/Evaluation: Continue  to Monitor: - Growth trends - PO intake - Fluid intake - Constipation  Follow-up in 6 months, joint with Dr. Waddell.  Total time spent in assessment, counseling and documentation: 90 minutes.

## 2024-04-22 NOTE — Progress Notes (Incomplete)
 Patient: Nicholas [name] MRN: [id number] Sex: male DOB: 2005-06-07  Provider: Corean Geralds, MD Location of Care: Pediatric Specialist- Pediatric Complex Care Note type: Routine return visit  History of Present Illness:  Nicholas [name] is a 18 y.o. male with history of prematurity, E.Coli meningitis s/p shunt placement and Lennox Gastaut syndrome who I am seeing in follow-up for complex care management. Patient was last seen on 01/30/2024 where I stopped baclofen , started a PRN dose of Risperdal , started Miralax, continued medications, planned to order a different formula supplement, considered starting a medication for appetite, referred to home-based primary care at [facility], considered referring to [facility], and planned to follow up on ABA referral.  Since that appointment, patient's father reached out on 02/03/2024 to report that he wasn't sleeping well so trazodone  was increased. He also reported that his appetite had decreased, possibly due to an illness, so dad was going to monitor if he continued to have trouble with his appetite. Dad reached out on 04/13/2024 that patient's aggression was worse so dad agreed to a new referral for ABA. Trazodone  was also further increased and I recommended short term use of benadryl for sleep.   Patient presents today with {CHL AMB PARENT/GUARDIAN:[id number]} who reports the following:   Symptom management:     Care coordination (other providers):  Case management needs:  Patient's parents had turned down the ABA referral due to his sporadic aggression and his schedule. They have since agreed to a new referral to work on aggressive behaviors.   Equipment needs:   Decision making/Advanced care planning:  Diagnostics/Patient history:  Seizure history:  Seizure semiology: Tremors lasting 30-45 seconds. Can progress to GTC seizures.    Current antiepileptic Drugs:Onfi  and Epidiolex    Previous Antiepileptic Drugs (AED): as an infant was on  phenobarbital and dilantin. When taken off these medications, he had seizures, when restarted on meds, tried new medications.     Risk Factors: hx of hydrocephalus s/p shunt placement.    Last seizure: 06/06/22   Relevent imaging/EEGS:  EEG January 11, 2012, showed continuous high-voltage spike and slow wave activity consistent with Lennox-Gastaut encephalopathy.   2005 VP shunt    01/11/2012 EEG: Profoundly abnormal EEG generalized slow       spike & wave discharge consistent with Lennox-Gastaut        encephalopathy. 04/10/2018 Head CT without Contrast : Moderate hydrocephalus. This appears increased since 2010 exam  Past Medical History Past Medical History:  Diagnosis Date   Cerebral palsy (HCC)    Developmental delay    Hemorrhage in the brain [facility])    Meningitis due to bacteria    Seizures (HCC)    VP (ventriculoperitoneal) shunt status     Surgical History Past Surgical History:  Procedure Laterality Date   CIRCUMCISION  2005   HIP SURGERY  2011   2 Hip surgeries    TYMPANOSTOMY TUBE PLACEMENT  2010   VENTRICULOPERITONEAL SHUNT  2005   VENTRICULOPERITONEAL SHUNT REVISION  2011    Family History family history includes Heart Problems in his paternal grandmother.   Social History Social History   Social History Narrative   Nicholas [name] attends [employer] and does well in school.    He recieves ST, OT, & PT in school.    He does not receive any outpatient therapies.    He lives with his mom and dad.   He enjoys toys, TV (Yo Keary Keary, [employer], [employer]), and music.     Allergies  Allergies  Allergen Reactions   Valium [Diazepam ]     Gets very angry.   Clonidine  Other (See Comments)    Other   Hydrocodone-Acetaminophen  Other (See Comments)    Makes his mental status much worse     Medications Current Outpatient Medications on File Prior to Visit  Medication Sig Dispense Refill   baclofen  (LIORESAL ) 10 MG tablet Take 1 tablet (10 mg total)  by mouth at bedtime. 185 tablet 2   EPIDIOLEX  100 MG/ML solution Take 0.5 mLs (50 mg total) by mouth 2 (two) times daily. 100 mL 3   mineral oil liquid Take 15 mLs by mouth daily as needed.     Nutritional Supplements (NUTRITIONAL SUPPLEMENT PLUS) LIQD 1-2 Ensure daily by mouth.  Requesting chocolate and clear.     Please send 60 containers monthly 14220 mL 12   ONFI  2.5 MG/ML solution GIVE IN THE MORNING AND AT NIGHT 240 mL 5   polyethylene glycol powder (GLYCOLAX/MIRALAX) 17 GM/SCOOP powder Take 17 g by mouth daily.     risperiDONE  (RISPERDAL ) 1 MG/ML oral solution TAKE 1.0 ML BY MOUTH IN THE MORNING AND TAKE 1.0 ML AT NIGHT. May give extra 1ml prior to known agitative times. 270 mL 3   traZODone  (DESYREL ) 50 MG tablet Take 1 tablet (50 mg total) by mouth at bedtime. 140 tablet 3   VALTOCO  10 MG DOSE 10 MG/0.1ML LIQD Give 10 mg via nasal spray dispenser in nostril after 2 minutes of seizures 2 each 5   VALTOCO  10 MG DOSE 10 MG/0.1ML LIQD Place 1 spray into the nose as needed (for seizure last longer than 2 minutes). 5 each 5   Wheat Dextrin (BENEFIBER) POWD 4g of Benefiber 3x/day added to beverages or purees. 360 g 12   No current facility-administered medications on file prior to visit.   The medication list was reviewed and reconciled. All changes or newly prescribed medications were explained.  A complete medication list was provided to the patient/caregiver.  Physical Exam There were no vitals taken for this visit. Weight for age: Facility age limit for growth %iles is 20 years.  Length for age: Facility age limit for growth %iles is 20 years. BMI: There is no height or weight on file to calculate BMI. No results found.   Diagnosis: No diagnosis found.   Assessment and Plan Nicholas [name] is a 18 y.o. male with history of prematurity, E.Coli meningitis s/p shunt placement and Lennox Gastaut syndrome who presents for follow-up in the pediatric complex care  clinic.  Symptom management:     Care coordination:  Case management needs:   Equipment needs:  Due to patient's medical condition, patient is indefinitely incontinent of stool and urine.  It is medically necessary for them to use diapers, underpads, and gloves to assist with hygiene and skin integrity.  They require a frequency of up to 200 a month.   Decision making/Advanced care planning:  The CARE PLAN for reviewed and revised to represent the changes above.  This is available in Epic under snapshot, and a physical binder provided to the patient, that can be used for anyone providing care for the patient.    I spend ** minutes on day of service on this patient including review of chart, discussion with patient and family, coordination with other providers and management of orders and paperwork.      No follow-ups on file.  Corean Geralds MD MPH Neurology,  Neurodevelopment and Neuropalliative care [facility]  Pediatric Specialists Child Neurology  [address] [place], [place], [place] [address] Phone: [phone]

## 2024-04-23 ENCOUNTER — Telehealth (INDEPENDENT_AMBULATORY_CARE_PROVIDER_SITE_OTHER): Payer: Self-pay | Admitting: Pediatrics

## 2024-04-23 DIAGNOSIS — R634 Abnormal weight loss: Secondary | ICD-10-CM

## 2024-04-23 MED ORDER — NUTRITIONAL SUPPLEMENT PLUS PO LIQD: ORAL | 12 refills | Status: AC

## 2024-04-23 NOTE — Telephone Encounter (Signed)
 Contacted patients father again.   After looking through his chart with the dietician, dad informed provider that Nobel did not like the Boost.   Dad stated that they have since been able to get Dandrae to drink the boost but he still prefers the pedisure.   I asked dad if [employer] is in need of an Set Designer.   Dad was unsure.   SS, CCMA

## 2024-04-23 NOTE — Telephone Encounter (Signed)
 Contacted patients father.  Verified patients name and DOB as well as fathers name.   Dad stated that he has 3 bottles left and needs a prescription sent to [facility] so that he does not run out.   SS, CCMA

## 2024-04-23 NOTE — Telephone Encounter (Signed)
°  Name of who is calling: Roger  Caller's Relationship to Patient: Dad  Best contact number: [phone]  Provider they see: Waddell  Reason for call: [facility] is telling him he needs a RX for Nicholas [name]'s Boost. Please reach out to dad about getting the Rx     PRESCRIPTION REFILL ONLY  Name of prescription:  Pharmacy:

## 2024-04-23 NOTE — Telephone Encounter (Signed)
 Patient will not qualify for Pediasure since he is 18yo.  We discussed at last appointment trying a different formula and I put in an order for Ensure.  I've signed a new order and placed on Sierra's (new) desk.    Corean Geralds MD MPH

## 2024-04-24 NOTE — Telephone Encounter (Signed)
 Order faxed to [facility]/   SS, CCMA

## 2024-04-30 ENCOUNTER — Ambulatory Visit (INDEPENDENT_AMBULATORY_CARE_PROVIDER_SITE_OTHER): Payer: Self-pay

## 2024-04-30 ENCOUNTER — Ambulatory Visit (INDEPENDENT_AMBULATORY_CARE_PROVIDER_SITE_OTHER): Payer: MEDICAID | Admitting: Pediatrics

## 2024-05-04 ENCOUNTER — Telehealth (INDEPENDENT_AMBULATORY_CARE_PROVIDER_SITE_OTHER): Payer: Self-pay

## 2024-05-04 NOTE — Telephone Encounter (Signed)
 Received a call from patients father . Verified patients name and DOB as well as fathers name.   Dad asked for office notes to be sent to [facility] to support the new order of Ensure.   Faxed office noted to [facility].   Best, Wells RAMAN., CCMA

## 2024-06-03 NOTE — Progress Notes (Incomplete)
 "  Patient: Nicholas [name] MRN: [id number] Sex: male DOB: [birth year]  Provider: Corean Geralds, MD Location of Care: Pediatric Specialist- Pediatric Complex Care Note type: Routine return visit  History of Present Illness: Referral Source: Elsie Conner, MD History from: patient and prior records Chief Complaint: Complex Care  Nicholas [name] is a 19 y.o. male with history of prematurity, E.Coli meningitis s/p shunt placement and Lennox Gastaut syndrome who I am seeing in follow-up for complex care management. Patient was last seen on 01/30/2024 where I stopped baclofen , started a PRN dose of Risperdal , started Miralax, continued other medications, planned to order a new formula, referred to [facility] home-based primary care, planned to follow up on ABA referral, and discussed referral to [facility] in-home PT.  Since that appointment, patient's father reached out on 02/03/2024 to report that Nicholas [name] hadn't been sleeping well since decreasing baclofen  and that his appetite was poor, possibly related to illness. Trazodone  was increased for sleep. Dad reached out on 04/13/2024 to report increased aggression and trouble sleeping after an illness. Benadryl was recommended for short-term sleep issues, and a new referral was sent to [facility] to address aggression. An order was sent for Ensure on 04/23/2024 due to patient not liking Boost.    Patient presents today with {CHL AMB PARENT/GUARDIAN:[id number]} who reports the following:   Symptom management:     Care coordination (other providers):  Case management needs:   Equipment needs:   Decision making/Advanced care planning:  Diagnostics/Patient history:  Seizure history:  Seizure semiology: Tremors lasting 30-45 seconds. Can progress to GTC seizures.    Current antiepileptic Drugs:Onfi  and Epidiolex    Previous Antiepileptic Drugs (AED): as an infant was on phenobarbital and dilantin. When taken off these medications, he had  seizures, when restarted on meds, tried new medications.     Risk Factors: hx of hydrocephalus s/p shunt placement.    Last seizure: 06/06/22   Relevent imaging/EEGS:  EEG January 11, 2012, showed continuous high-voltage spike and slow wave activity consistent with Lennox-Gastaut encephalopathy.   2005 VP shunt    01/11/2012 EEG: Profoundly abnormal EEG generalized slow       spike & wave discharge consistent with Lennox-Gastaut        encephalopathy. 04/10/2018 Head CT without Contrast : Moderate hydrocephalus. This appears increased since 2010 exam  Past Medical History Past Medical History:  Diagnosis Date   Cerebral palsy (HCC)    Developmental delay    Hemorrhage in the brain [facility])    Meningitis due to bacteria    Seizures (HCC)    VP (ventriculoperitoneal) shunt status     Surgical History Past Surgical History:  Procedure Laterality Date   CIRCUMCISION  2005   HIP SURGERY  2011   2 Hip surgeries    TYMPANOSTOMY TUBE PLACEMENT  2010   VENTRICULOPERITONEAL SHUNT  2005   VENTRICULOPERITONEAL SHUNT REVISION  2011    Family History family history includes Heart Problems in his paternal grandmother.   Social History Social History   Social History Narrative   Venice attends [employer] and does well in school.    He recieves ST, OT, & PT in school.    He does not receive any outpatient therapies.    He lives with his mom and dad.   He enjoys toys, TV (Yo Keary Keary, [employer], [employer]), and music.     Allergies Allergies[1]  Medications Medications Ordered Prior to Encounter[2] The medication list was reviewed and reconciled. All changes or  newly prescribed medications were explained.  A complete medication list was provided to the patient/caregiver.  Physical Exam There were no vitals taken for this visit. Weight for age: Facility age limit for growth %iles is 20 years.  Length for age: Facility age limit for growth %iles is 20 years. BMI:  There is no height or weight on file to calculate BMI. No results found.   Diagnosis: No diagnosis found.   Assessment and Plan Nicholas [name] is a 19 y.o. male with history of prematurity, E.Coli meningitis s/p shunt placement and Lennox Gastaut syndrome who presents for follow-up in the pediatric complex care clinic.  Symptom management:     Care coordination:  Case management needs:   Equipment needs:  Due to patient's medical condition, patient is indefinitely incontinent of stool and urine.  It is medically necessary for them to use diapers, underpads, and gloves to assist with hygiene and skin integrity.  They require a frequency of up to 200 a month.   Decision making/Advanced care planning:  The CARE PLAN for reviewed and revised to represent the changes above.  This is available in Epic under snapshot, and a physical binder provided to the patient, that can be used for anyone providing care for the patient.    I spend ** minutes on day of service on this patient including review of chart, discussion with patient and family, coordination with other providers and management of orders and paperwork.      No follow-ups on file.  Corean Geralds MD MPH Neurology,  Neurodevelopment and Neuropalliative care [facility] Child Neurology  [address] [place], [place], [place] [address] Phone: [phone]     [1]  Allergies Allergen Reactions   Valium [Diazepam ]     Gets very angry.   Clonidine  Other (See Comments)    Other   Hydrocodone-Acetaminophen  Other (See Comments)    Makes his mental status much worse   [2]  Current Outpatient Medications on File Prior to Visit  Medication Sig Dispense Refill   baclofen  (LIORESAL ) 10 MG tablet Take 1 tablet (10 mg total) by mouth at bedtime. 185 tablet 2   EPIDIOLEX  100 MG/ML solution Take 0.5 mLs (50 mg total) by mouth 2 (two) times daily. 100 mL 3   mineral oil liquid Take 15 mLs by mouth daily as needed.      Nutritional Supplements (NUTRITIONAL SUPPLEMENT PLUS) LIQD 1-2 Ensure daily by mouth.  Requesting chocolate and clear.     Please send 60 containers monthly 14220 mL 12   ONFI  2.5 MG/ML solution GIVE IN THE MORNING AND AT NIGHT 240 mL 5   polyethylene glycol powder (GLYCOLAX/MIRALAX) 17 GM/SCOOP powder Take 17 g by mouth daily.     risperiDONE  (RISPERDAL ) 1 MG/ML oral solution TAKE 1.0 ML BY MOUTH IN THE MORNING AND TAKE 1.0 ML AT NIGHT. May give extra 1ml prior to known agitative times. 270 mL 3   traZODone  (DESYREL ) 50 MG tablet Take 1 tablet (50 mg total) by mouth at bedtime. 140 tablet 3   VALTOCO  10 MG DOSE 10 MG/0.1ML LIQD Give 10 mg via nasal [name] dispenser in nostril after 2 minutes of seizures 2 each 5   VALTOCO  10 MG DOSE 10 MG/0.1ML LIQD Place 1 [name] into the nose as needed (for seizure last longer than 2 minutes). 5 each 5   Wheat Dextrin (BENEFIBER) POWD 4g of Benefiber 3x/day added to beverages or purees. 360 g 12   No current  facility-administered medications on file prior to visit.   "

## 2024-06-04 ENCOUNTER — Ambulatory Visit (INDEPENDENT_AMBULATORY_CARE_PROVIDER_SITE_OTHER): Payer: MEDICAID

## 2024-06-08 ENCOUNTER — Ambulatory Visit (INDEPENDENT_AMBULATORY_CARE_PROVIDER_SITE_OTHER): Payer: MEDICAID | Admitting: Pediatrics

## 2024-07-20 ENCOUNTER — Ambulatory Visit (INDEPENDENT_AMBULATORY_CARE_PROVIDER_SITE_OTHER): Payer: MEDICAID | Admitting: Pediatrics
